# Patient Record
Sex: Male | Born: 2000 | Race: Black or African American | Hispanic: No | Marital: Single | Smoking: Never smoker
Health system: Southern US, Community
[De-identification: ages and names within clinical notes are randomized; demographics above are authoritative.]

## PROBLEM LIST (undated history)

## (undated) DIAGNOSIS — Z87828 Personal history of other (healed) physical injury and trauma: Secondary | ICD-10-CM

## (undated) DIAGNOSIS — F429 Obsessive-compulsive disorder, unspecified: Secondary | ICD-10-CM

## (undated) DIAGNOSIS — F988 Other specified behavioral and emotional disorders with onset usually occurring in childhood and adolescence: Secondary | ICD-10-CM

## (undated) DIAGNOSIS — R569 Unspecified convulsions: Secondary | ICD-10-CM

## (undated) DIAGNOSIS — Z87898 Personal history of other specified conditions: Secondary | ICD-10-CM

## (undated) DIAGNOSIS — K429 Umbilical hernia without obstruction or gangrene: Secondary | ICD-10-CM

## (undated) DIAGNOSIS — F902 Attention-deficit hyperactivity disorder, combined type: Secondary | ICD-10-CM

## (undated) DIAGNOSIS — R278 Other lack of coordination: Secondary | ICD-10-CM

## (undated) DIAGNOSIS — R51 Headache: Secondary | ICD-10-CM

## (undated) HISTORY — DX: Attention-deficit hyperactivity disorder, combined type: F90.2

## (undated) HISTORY — DX: Other lack of coordination: R27.8

## (undated) HISTORY — DX: Obsessive-compulsive disorder, unspecified: F42.9

---
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
## 2018-01-04 ENCOUNTER — Other Ambulatory Visit: Payer: Self-pay | Admitting: Pediatrics

## 2018-01-05 NOTE — Telephone Encounter (Signed)
Last visit 05/15/2017 next visit 01/19/2018

## 2018-01-05 NOTE — Telephone Encounter (Signed)
E-Prescribed Atomoxetine 80 mg directly to  [facility] [place] [place] - [address] [place] [id number] Phone: [phone] Fax: [phone]

## 2018-01-05 NOTE — Telephone Encounter (Signed)
Patient scheduled for August 5.

## 2018-01-19 ENCOUNTER — Telehealth: Payer: Self-pay | Admitting: Pediatrics

## 2018-01-19 ENCOUNTER — Ambulatory Visit (INDEPENDENT_AMBULATORY_CARE_PROVIDER_SITE_OTHER): Payer: Self-pay | Admitting: Pediatrics

## 2018-01-19 ENCOUNTER — Encounter: Payer: Self-pay | Admitting: Pediatrics

## 2018-01-19 VITALS — BP 119/82 | HR 82 | Ht 66.0 in | Wt 148.0 lb

## 2018-01-19 DIAGNOSIS — F422 Mixed obsessional thoughts and acts: Secondary | ICD-10-CM

## 2018-01-19 DIAGNOSIS — Z79899 Other long term (current) drug therapy: Secondary | ICD-10-CM

## 2018-01-19 DIAGNOSIS — Z7189 Other specified counseling: Secondary | ICD-10-CM

## 2018-01-19 DIAGNOSIS — R278 Other lack of coordination: Secondary | ICD-10-CM

## 2018-01-19 DIAGNOSIS — Z719 Counseling, unspecified: Secondary | ICD-10-CM

## 2018-01-19 DIAGNOSIS — F902 Attention-deficit hyperactivity disorder, combined type: Secondary | ICD-10-CM

## 2018-01-19 NOTE — Patient Instructions (Addendum)
DISCUSSION: Patient counseled regarding the following coordination of care items:  No medications today. Patient will transition to adult provider. Transition care to PCP due to age and not currently taking medication.  Mother to contact medicaid to figure out coverage.  Advised importance of:  Good sleep hygiene (8- 10 hours per night) Immediately improve bedtime Limited screen time (none on school nights, no more than 2 hours on weekends) Significant reduction in screen time Regular exercise(outside and active play) Healthy eating (drink water, no sodas/sweet tea, limit portions and no seconds).  Additionally the patient was counseled to take medication while driving.  Teens need about 9 hours of sleep a night. Younger children need more sleep (10-11 hours a night) and adults need slightly less (7-9 hours each night).  11 Tips to Follow:  1. No caffeine after 3pm: Avoid beverages with caffeine (soda, tea, energy drinks, etc.) especially after 3pm. 2. Don't go to bed hungry: Have your evening meal at least 3 hrs. before going to sleep. It's fine to have a small bedtime snack such as a glass of milk and a few crackers but don't have a big meal. 3. Have a nightly routine before bed: Plan on "winding down" before you go to sleep. Begin relaxing about 1 hour before you go to bed. Try doing a quiet activity such as listening to calming music, reading a book or meditating. 4. Turn off the TV and ALL electronics including video games, tablets, laptops, etc. 1 hour before sleep, and keep them out of the bedroom. 5. Turn off your cell phone and all notifications (new email and text alerts) or even better, leave your phone outside your room while you sleep. Studies have shown that a part of your brain continues to respond to certain lights and sounds even while you're still asleep. 6. Make your bedroom quiet, dark and cool. If you can't control the noise, try wearing earplugs or using a fan to block  out other sounds. 7. Practice relaxation techniques. Try reading a book or meditating or drain your brain by writing a list of what you need to do the next day. 8. Don't nap unless you feel sick: you'll have a better night's sleep. 9. Don't smoke, or quit if you do. Nicotine, alcohol, and marijuana can all keep you awake. Talk to your health care provider if you need help with substance use. 10. Most importantly, wake up at the same time every day (or within 1 hour of your usual wake up time) EVEN on the weekends. A regular wake up time promotes sleep hygiene and prevents sleep problems. 11. Reduce exposure to bright light in the last three hours of the day before going to sleep. Maintaining good sleep hygiene and having good sleep habits lower your risk of developing sleep problems. Getting better sleep can also improve your concentration and alertness. Try the simple steps in this guide. If you still have trouble getting enough rest, make an appointment with your health care provider.  Parent/teen counseling is recommended and may include Family counseling.  Consider the following options: [facility]  http://famsolutions.org/ [phone]- [phone]  [facility]  http://www.youthfocus.org/home.html 336 [phone]  Additional resources: COUNSELING AGENCIES in [place] (Accepting Medicaid)  [phone] service coordination hub Provides information on mental health, intellectual/developmental disabilities & substance abuse services in [facility] [address]  "[place]"           [phone] [facility] [address]          [phone] [facility] [address]            [phone]  [facility] [address]            [phone]  [facility] [address]           [phone] [facility] [phone]    [phone]   Habla Espaol/Interprete  [facility] [address]            [phone]   [facility] [address]             [phone] [facility] (SEL) [address]  [phone]  Psychiatric services/servicios psiquiatricos  & Habla Espaol/Interprete [facility] [address]   [phone] [facility] [address]      [phone] [facility] [address] (19 yo & over only)     [phone]   [facility]  [address], [place], [place] [address]                         [phone]

## 2018-01-19 NOTE — Telephone Encounter (Signed)
Called and left message patient show up appointment.

## 2018-01-19 NOTE — Progress Notes (Signed)
[facility] [address], [place]. [address] [place] [address] Dept: [phone] Dept Fax: [phone] Loc: [phone] Loc Fax: [phone]  Medical Follow-up  Patient ID: Bradley [name], male  DOB: [birth year], 17 y.o.  MRN: [id number]  Date of Evaluation: 01/19/18  PCP: Estrella Myrtle, MD  Accompanied by: Self, mother arrived at the end of the visit Patient Lives with: mother and brother Onalee Hua is 12 years  HISTORY/CURRENT STATUS:  Chief Complaint - Polite and cooperative and present for medical follow up for medication management of ADHD, dysgraphia and anxiety/OCD.  Last follow up in November 2018.  Patient is prescribed Strattera 80 mg daily and Evekeo 10 to 20 mg daily.  Not taking medication since end of school in May.  Was away from home and was staying at friends because of work and did not go on meds. Not sure if he wants to be on meds "a lot of stuff has happened" not because of meds.  Not sleeping, reports disrespectful to mother lately.  Recently, three weeks ago, girlfriend committed suicide. Was only dating for about two months. Will think in song - songs for people and words, but not saying out loud.  Not really a problem does it more with friends. Not sure about classes at [employer]. Mother reports he is irritable, more energetic, busier and spending a lot of money.  Buying shoes and clothes.   EDUCATION: School:  Graduated in May 2019  [employer]  Year/Grade: 12th grade  Did finish [employer] Mother reports graduated "by grace". 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  He has not declared a last day.  Not applying for other things.  Wanted to go to [employer] for college but mother did not want to let him go  incase he has transition issues.  Will take classes at [employer]. Not sure when classes start, due to last minute decision to go to [employer] Wants to do A&P, does not want to do math. Wants to do sports medicine.  Plays some sports pick up, not organized  MEDICAL HISTORY: Appetite: WNL  Sleep: Bedtime: 0230  Awake: 0800-1600 Argues that he isn't tired and that his friends are in other countries. Point counter point with conversation about getting better sleep  No concerns for toileting. Daily stool, no constipation or diarrhea. Void urine no difficulty. No enuresis.   Participate in daily oral hygiene to include brushing and flossing.  Individual Medical History/Review of System Changes?Yes, pulled hamstring in March, went to ED.  Allergies: Patient has no known allergies.  Current Medications:  Not currently taking medication Medication Side Effects: None  Family Medical/Social History Changes?: No  MENTAL HEALTH: Mental Health Issues: Denies sadness, loneliness or depression. No self harm or thoughts of self harm or injury. Denies fears, worries and anxieties. Continued OCD but not worse.  Has good peer relations and is not a bully nor is victimized. Girlfriend of 2 months, committed suicide 3 weeks ago.  Also has friends that "use him" for counseling.   Review of Systems  Constitutional: Negative.   HENT: Negative.   Eyes: Negative.   Respiratory: Negative.   Cardiovascular: Negative.   Gastrointestinal: Negative.   Endocrine: Negative.   Genitourinary: Negative.   Musculoskeletal: Negative.   Skin: Negative.   Allergic/Immunologic: Negative.  Neurological: Negative for seizures and headaches.  Hematological: Negative.   Psychiatric/Behavioral: Negative for behavioral problems, decreased concentration and sleep disturbance. The patient is not nervous/anxious and is not hyperactive.   All other systems reviewed and are negative.  PHYSICAL EXAM: Vitals:  Today's  Vitals   01/19/18 1121  BP: 119/82  Pulse: 82  Weight: 148 lb (67.1 kg)  Height: 5\' 6"  (1.676 m)  , 65 %ile (Z= 0.38) based on CDC (Boys, 2-20 Years) BMI-for-age based on BMI available as of 01/19/2018. Body mass index is 23.89 kg/m.  General Exam: Physical Exam  Constitutional: He is oriented to person, place, and time. Vital signs are normal. He appears well-developed and well-nourished. He is cooperative. No distress.  HENT:  Head: Normocephalic.  Right Ear: Tympanic membrane and ear canal normal.  Left Ear: Tympanic membrane and ear canal normal.  Nose: Nose normal.  Mouth/Throat: Uvula is midline, oropharynx is clear and moist and mucous membranes are normal.  Eyes: Pupils are equal, round, and reactive to light. Conjunctivae, EOM and lids are normal.  Neck: Normal range of motion. Neck supple. No thyromegaly present.  Cardiovascular: Normal rate, regular rhythm and intact distal pulses.  Pulmonary/Chest: Effort normal and breath sounds normal.  Abdominal: Soft. Normal appearance.  Genitourinary:  Genitourinary Comments: Deferred  Musculoskeletal: Normal range of motion.  Neurological: He is alert and oriented to person, place, and time. He has normal strength and normal reflexes. He displays no tremor. No cranial nerve deficit or sensory deficit. He exhibits normal muscle tone. He displays a negative Romberg sign. He displays no seizure activity. Coordination and gait normal.  Skin: Skin is warm, dry and intact.  Psychiatric: He has a normal mood and affect. His speech is normal and behavior is normal. Judgment and thought content normal. His mood appears not anxious. His affect is not inappropriate. He is not agitated, not aggressive and not hyperactive. Cognition and memory are normal. He does not express impulsivity or inappropriate judgment. He expresses no suicidal ideation. He expresses no suicidal plans. He is attentive.  Vitals reviewed.  Neurological: oriented to place  and person  Testing/Developmental Screens: CGI:19/12  Reviewed with patient       DIAGNOSES:    ICD-10-CM   1. ADHD (attention deficit hyperactivity disorder), combined type F90.2   2. Dysgraphia R27.8   3. Mixed obsessional thoughts and acts F42.2   4. Medication management Z79.899   5. Patient counseled Z71.9   6. Counseling and coordination of care Z71.89     RECOMMENDATIONS:  Patient Instructions  DISCUSSION: Patient counseled regarding the following coordination of care items:  No medications today. Patient will transition to adult provider. Transition care to PCP due to age and not currently taking medication.  Mother to contact medicaid to figure out coverage.  Advised importance of:  Good sleep hygiene (8- 10 hours per night) Immediately improve bedtime Limited screen time (none on school nights, no more than 2 hours on weekends) Significant reduction in screen time Regular exercise(outside and active play) Healthy eating (drink water, no sodas/sweet tea, limit portions and no seconds).  Additionally the patient was counseled to take medication while driving.  Teens need about 9 hours of sleep a night. Younger children need more sleep (10-11 hours a night) and adults need slightly less (7-9 hours each night).  11 Tips to Follow:  1. No caffeine after 3pm: Avoid beverages with caffeine (soda, tea, energy drinks, etc.) especially after 3pm. 2. Don't go to bed hungry: Have  your evening meal at least 3 hrs. before going to sleep. It's fine to have a small bedtime snack such as a glass of milk and a few crackers but don't have a big meal. 3. Have a nightly routine before bed: Plan on "winding down" before you go to sleep. Begin relaxing about 1 hour before you go to bed. Try doing a quiet activity such as listening to calming music, reading a book or meditating. 4. Turn off the TV and ALL electronics including video games, tablets, laptops, etc. 1 hour before sleep, and  keep them out of the bedroom. 5. Turn off your cell phone and all notifications (new email and text alerts) or even better, leave your phone outside your room while you sleep. Studies have shown that a part of your brain continues to respond to certain lights and sounds even while you're still asleep. 6. Make your bedroom quiet, dark and cool. If you can't control the noise, try wearing earplugs or using a fan to block out other sounds. 7. Practice relaxation techniques. Try reading a book or meditating or drain your brain by writing a list of what you need to do the next day. 8. Don't nap unless you feel sick: you'll have a better night's sleep. 9. Don't smoke, or quit if you do. Nicotine, alcohol, and marijuana can all keep you awake. Talk to your health care provider if you need help with substance use. 10. Most importantly, wake up at the same time every day (or within 1 hour of your usual wake up time) EVEN on the weekends. A regular wake up time promotes sleep hygiene and prevents sleep problems. 11. Reduce exposure to bright light in the last three hours of the day before going to sleep. Maintaining good sleep hygiene and having good sleep habits lower your risk of developing sleep problems. Getting better sleep can also improve your concentration and alertness. Try the simple steps in this guide. If you still have trouble getting enough rest, make an appointment with your health care provider.  Parent/teen counseling is recommended and may include Family counseling.  Consider the following options: [facility]  http://famsolutions.org/ [phone]- [phone]  [facility]  http://www.youthfocus.org/home.html 336 [phone]) [phone]  Additional resources: COUNSELING AGENCIES in [place] (Accepting Medicaid)  [phone]- [phone] service coordination hub Provides information on mental health, intellectual/developmental disabilities & substance abuse services in [facility]  [place]   [facility] [address]  "[place]"           [phone] [facility] [address]          [phone] [facility] [address]            [phone]  [facility] [address]            [phone]  [facility] [address]           [phone] [facility] [address] [phone]    [phone]   Habla Espaol/Interprete  [facility] [address]            [phone]   [facility] [address]             [phone] [facility] (SEL) [address]  [phone]  Psychiatric services/servicios psiquiatricos  & Habla Espaol/Interprete [facility]  [address]   [phone] [facility] [address]      [phone] [facility] [address] (19 yo & over only)     [phone], [place], [place] [address]                         [phone]  Patient verbalized understanding of all topics discussed.   NEXT APPOINTMENT: Return if symptoms worsen or fail to improve. Medical Decision-making: More than 50% of the appointment was spent counseling and discussing diagnosis and management of symptoms with the patient and family.   Leticia Penna, NP Counseling Time: 40 Total Contact Time: 50

## 2018-07-29 IMAGING — DX DG HIP (WITH OR WITHOUT PELVIS) 2-3V*R*
3 series · 3 of 3 positions shown · non-contrast
Comparison: None.

CLINICAL DATA: Basketball injury tonight. Right hip and inguinal
pain.

EXAM:
DG HIP (WITH OR WITHOUT PELVIS) 2-3V RIGHT

[pelvis ap]
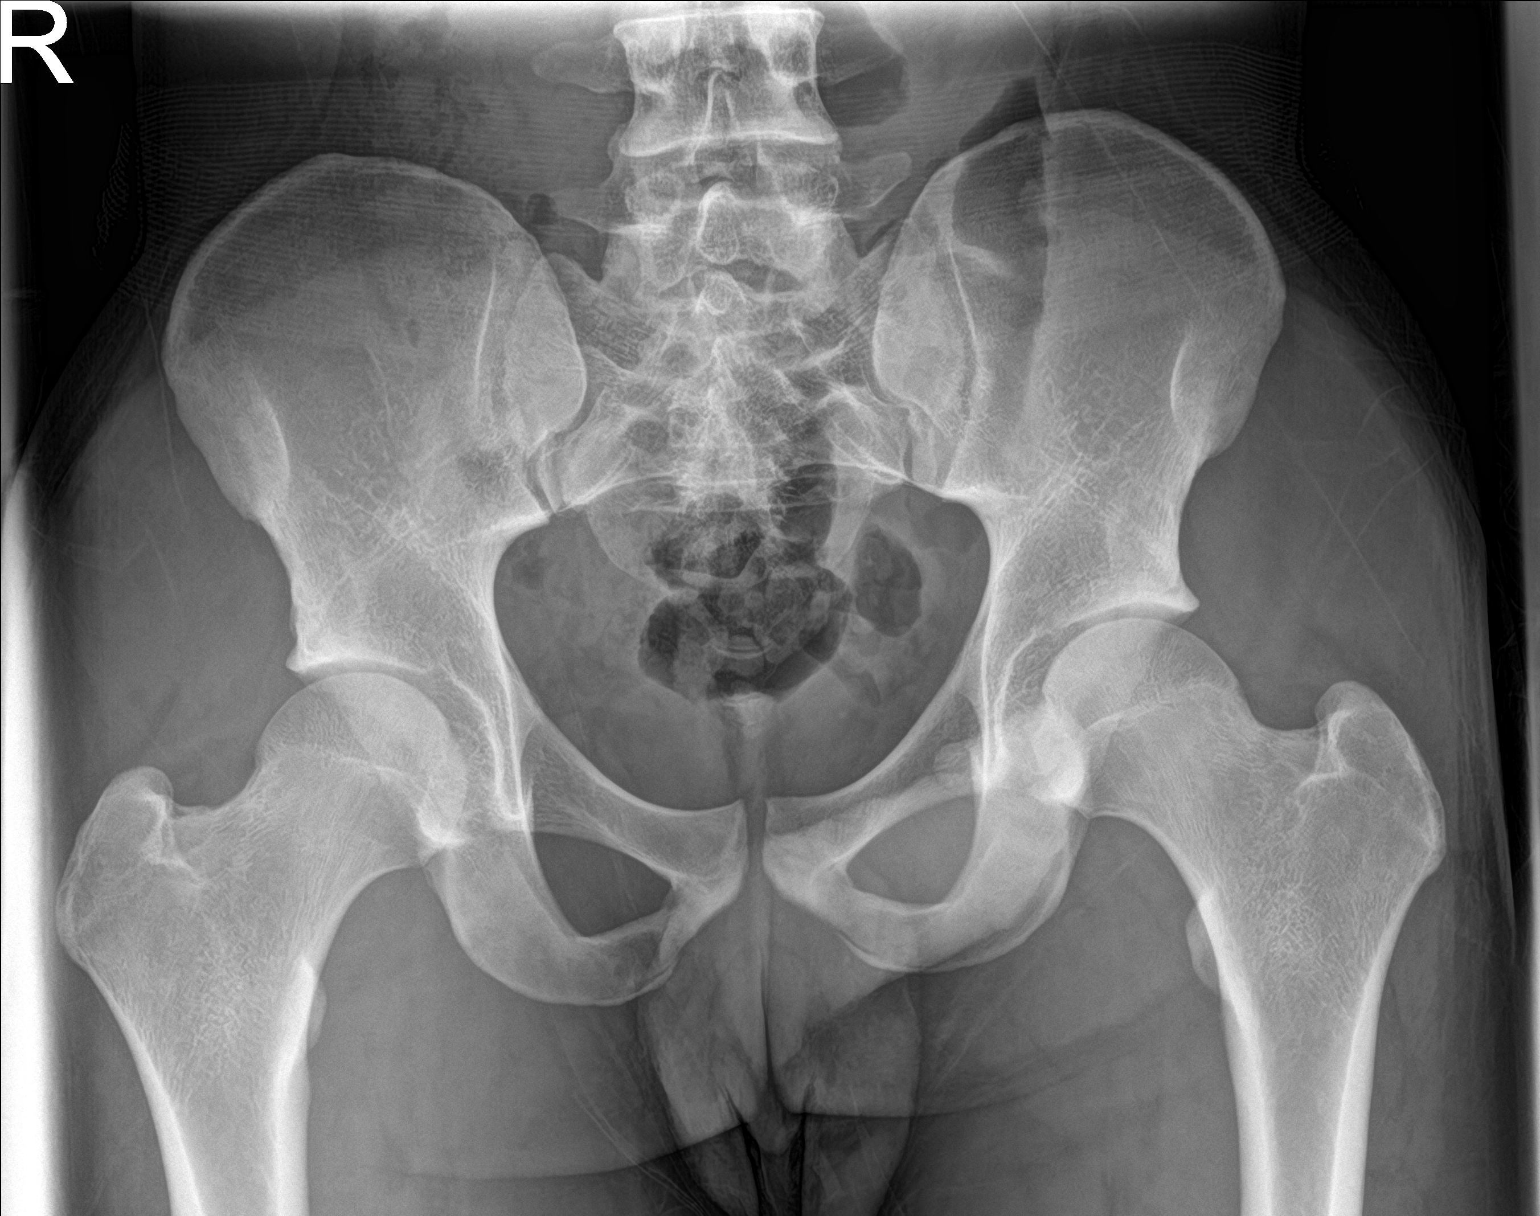

[hip ap]
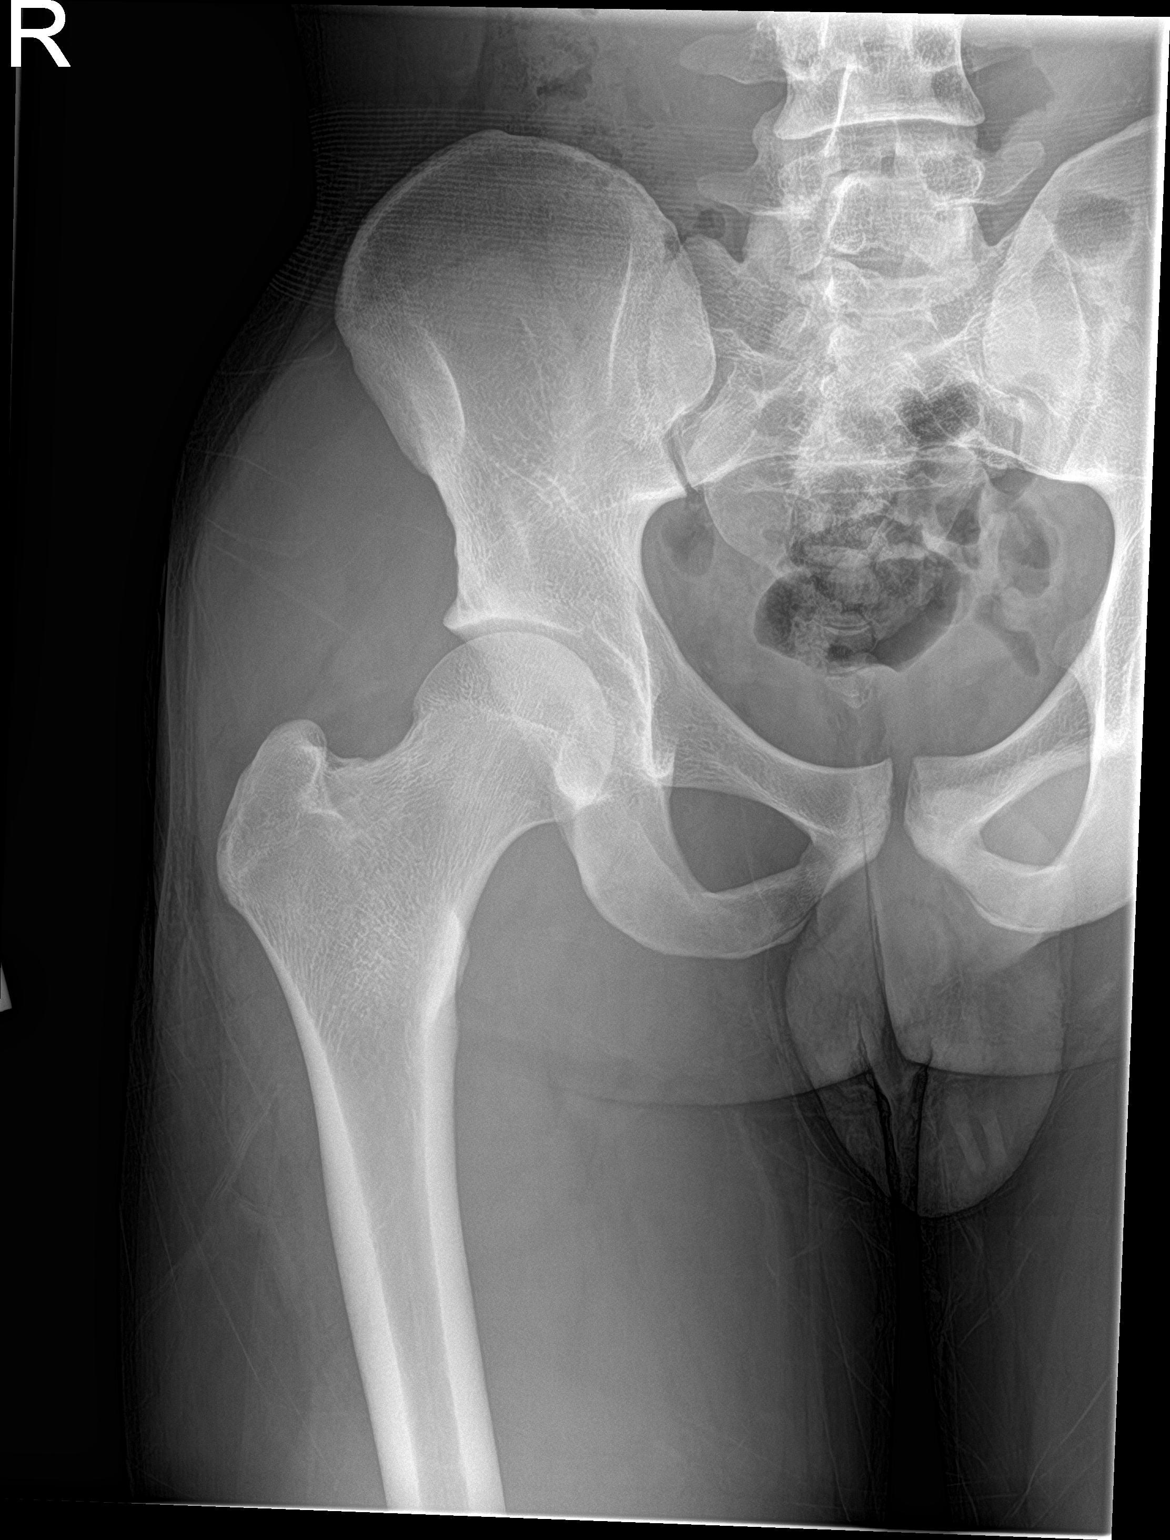

[hip lat]
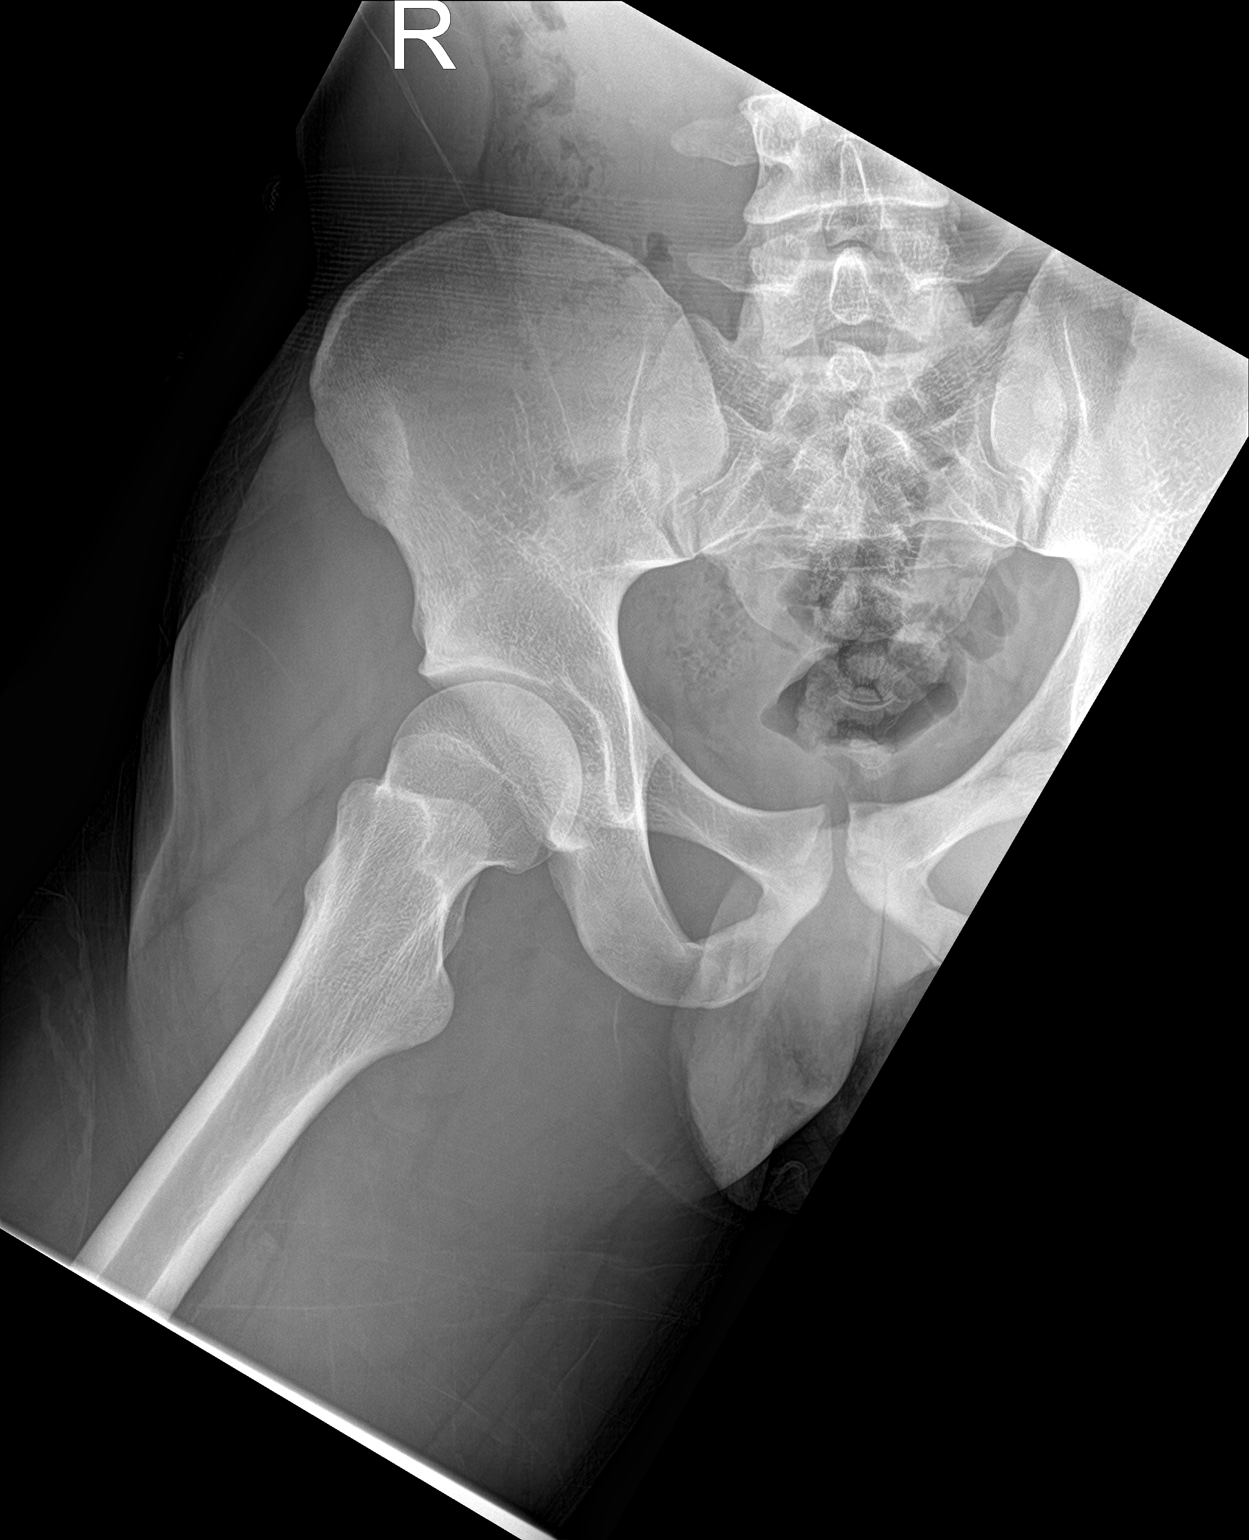

[3 of 3 positions shown; findings below may reference images not displayed]

FINDINGS: There is no evidence of hip fracture or dislocation. There is no
evidence of arthropathy or other focal bone abnormality.
IMPRESSION: Negative.

## 2019-06-18 HISTORY — PX: WISDOM TOOTH EXTRACTION: SHX21

## 2021-03-03 ENCOUNTER — Encounter (HOSPITAL_BASED_OUTPATIENT_CLINIC_OR_DEPARTMENT_OTHER): Payer: Self-pay | Admitting: Emergency Medicine

## 2021-03-03 ENCOUNTER — Emergency Department (HOSPITAL_BASED_OUTPATIENT_CLINIC_OR_DEPARTMENT_OTHER)
Admission: EM | Admit: 2021-03-03 | Discharge: 2021-03-03 | Disposition: A | Discharge status: Home / Self Care | Payer: Medicaid Other | Attending: Emergency Medicine | Admitting: Emergency Medicine

## 2021-03-03 ENCOUNTER — Other Ambulatory Visit: Payer: Self-pay

## 2021-03-03 DIAGNOSIS — T782XXA Anaphylactic shock, unspecified, initial encounter: Secondary | ICD-10-CM | POA: Diagnosis not present

## 2021-03-03 DIAGNOSIS — R0602 Shortness of breath: Secondary | ICD-10-CM | POA: Insufficient documentation

## 2021-03-03 DIAGNOSIS — L500 Allergic urticaria: Secondary | ICD-10-CM | POA: Insufficient documentation

## 2021-03-03 DIAGNOSIS — R079 Chest pain, unspecified: Secondary | ICD-10-CM | POA: Insufficient documentation

## 2021-03-03 MED ORDER — FAMOTIDINE 20 MG PO TABS: 20.0000 mg | ORAL_TABLET | Freq: Every day | ORAL | 0 refills | Status: DC

## 2021-03-03 MED ORDER — PREDNISONE 50 MG PO TABS: ORAL_TABLET | ORAL | 0 refills | Status: DC

## 2021-03-03 MED ORDER — DIPHENHYDRAMINE HCL 50 MG/ML IJ SOLN
25.0000 mg | Freq: Once | INTRAMUSCULAR | Status: AC
  Administered 2021-03-03: 25 mg via INTRAVENOUS

## 2021-03-03 MED ORDER — FAMOTIDINE IN NACL 20-0.9 MG/50ML-% IV SOLN
20.0000 mg | Freq: Once | INTRAVENOUS | Status: AC
  Administered 2021-03-03: 20 mg via INTRAVENOUS
  Filled 2021-03-03: qty 50

## 2021-03-03 MED ORDER — EPINEPHRINE 0.3 MG/0.3ML IJ SOAJ
0.3000 mg | Freq: Once | INTRAMUSCULAR | Status: AC
  Administered 2021-03-03: 0.3 mg via INTRAMUSCULAR
  Filled 2021-03-03: qty 0.3

## 2021-03-03 MED ORDER — PREDNISONE 50 MG PO TABS: ORAL_TABLET | ORAL | Status: DC

## 2021-03-03 MED ORDER — DIPHENHYDRAMINE HCL 50 MG/ML IJ SOLN
INTRAMUSCULAR | Status: AC
  Filled 2021-03-03: qty 1

## 2021-03-03 MED ORDER — DIPHENHYDRAMINE HCL 25 MG PO TABS: 25.0000 mg | ORAL_TABLET | Freq: Four times a day (QID) | ORAL | 0 refills | Status: DC | PRN

## 2021-03-03 MED ORDER — METHYLPREDNISOLONE SODIUM SUCC 125 MG IJ SOLR
125.0000 mg | Freq: Once | INTRAMUSCULAR | Status: AC
  Administered 2021-03-03: 125 mg via INTRAVENOUS
  Filled 2021-03-03: qty 2

## 2021-03-03 MED ORDER — EPINEPHRINE 0.3 MG/0.3ML IJ SOAJ: 0.3000 mg | INTRAMUSCULAR | 0 refills | Status: DC | PRN

## 2021-03-03 NOTE — ED Provider Notes (Signed)
[facility] EMERGENCY DEPARTMENT Provider Note   CSN: [id number] Arrival date & time: 03/03/21  0101     History Chief Complaint  Patient presents with   Allergic Reaction    Bradley [name] is a 20 y.o. male.  Patient presents with possible allergic reaction.  States 1 hour ago he developed swelling to his face, eyes, throat, itchy red rash.  His throat feels tight and he feels short of breath with some difficulty swallowing.  He was at a car meet around exhaust fumes but nothing unusual for him.  Drink some orange juice was the only thing that is new.  He has not had a problem with orange juice in the past.  Denies any new medications, clothing, other exposures.  No regular medication use. Feels tight in his chest and short of breath and tight in his throat.  Has a rash all over and swollen eyes and swollen face. This is never happened before  The history is provided by the patient.  Allergic Reaction Presenting symptoms: rash       Past Medical History:  Diagnosis Date   ADD (attention deficit disorder)    ADHD (attention deficit hyperactivity disorder), combined type 12/07/2015   Dysgraphia 12/07/2015   Headache(784.0)    almost daily - secondary to head injury   History of seizures 2011   after head injury - no seizures since 2013; no current med.   History of sprained ankle 10/2013   left   Obsessive compulsive disorder 12/07/2015   OCD (obsessive compulsive disorder)    Seizures (HCC)    not for 2 years,    Umbilical hernia 01/2014    Patient Active Problem List   Diagnosis Date Noted   Bilateral hip pain 09/02/2016   History of seizures as a child 05/23/2016   ADHD (attention deficit hyperactivity disorder), combined type 12/07/2015   Dysgraphia 12/07/2015   Obsessive compulsive disorder 12/07/2015    Past Surgical History:  Procedure Laterality Date   UMBILICAL HERNIA REPAIR N/A 02/18/2014   Procedure: HERNIA REPAIR UMBILICAL PEDIATRIC;   Surgeon: Judie Petit. Leonia Corona, MD;  Location: [facility];  Service: Pediatrics;  Laterality: N/A;       Family History  Problem Relation Age of Onset   Hypertension Maternal Grandmother    Asthma Maternal Grandmother    Diabetes Maternal Grandfather    Kidney disease Maternal Grandfather        renal failure/kidney transplant   Hypertension Paternal Grandmother    Diabetes Paternal Grandfather    Obsessive Compulsive Disorder Mother    Anxiety disorder Mother    Mental illness Father     Social History   Tobacco Use   Smoking status: Never   Smokeless tobacco: Never  Substance Use Topics   Alcohol use: No    Alcohol/week: 0.0 standard drinks   Drug use: No    Home Medications Prior to Admission medications   Not on File    Allergies    Patient has no known allergies.  Review of Systems   Review of Systems  Constitutional:  Negative for activity change, appetite change and fever.  HENT:  Negative for congestion and rhinorrhea.   Eyes:  Negative for visual disturbance.  Respiratory:  Positive for chest tightness and shortness of breath.   Gastrointestinal:  Negative for abdominal pain, nausea and vomiting.  Genitourinary:  Negative for dysuria and hematuria.  Musculoskeletal:  Negative for arthralgias and myalgias.  Skin:  Positive for rash.  Neurological:  Negative for dizziness, weakness and headaches.   all other systems are negative except as noted in the HPI and PMH.   Physical Exam Updated Vital Signs BP (!) 146/95   Pulse 84   Temp 98.3 F (36.8 C) (Oral)   Resp 15   Ht 5\' 7"  (1.702 m)   Wt 70.3 kg   SpO2 100%   BMI 24.28 kg/m   Physical Exam Vitals and nursing note reviewed.  Constitutional:      General: He is not in acute distress.    Appearance: He is well-developed.  HENT:     Head: Normocephalic and atraumatic.     Mouth/Throat:     Pharynx: No oropharyngeal exudate.     Comments: No drooling, no trismus no stridor,  tongue appears normal, uvula normal, lips normal Eyes:     Conjunctiva/sclera: Conjunctivae normal.     Pupils: Pupils are equal, round, and reactive to light.  Neck:     Comments: No meningismus. Cardiovascular:     Rate and Rhythm: Normal rate and regular rhythm.     Heart sounds: Normal heart sounds. No murmur heard. Pulmonary:     Effort: Pulmonary effort is normal. No respiratory distress.     Breath sounds: Normal breath sounds. No wheezing.  Chest:     Chest wall: No tenderness.  Abdominal:     Palpations: Abdomen is soft.     Tenderness: There is no abdominal tenderness. There is no guarding or rebound.  Musculoskeletal:        General: No tenderness. Normal range of motion.     Cervical back: Normal range of motion and neck supple.  Skin:    General: Skin is warm.     Findings: Rash present.     Comments: Diffuse urticaria involving arms, chest, back and abdomen  Neurological:     Mental Status: He is alert and oriented to person, place, and time.     Cranial Nerves: No cranial nerve deficit.     Motor: No abnormal muscle tone.     Coordination: Coordination normal.     Comments:  5/5 strength throughout. CN 2-12 intact.Equal grip strength.   Psychiatric:        Behavior: Behavior normal.    ED Results / Procedures / Treatments   Labs (all labs ordered are listed, but only abnormal results are displayed) Labs Reviewed - No data to display  EKG None  Radiology No results found.  Procedures .Critical Care Performed by: , MD Authorized by: Glynn Octave, MD   Critical care provider statement:    Critical care time (minutes):  35   Critical care was necessary to treat or prevent imminent or life-threatening deterioration of the following conditions: anaphylaxis.   Critical care was time spent personally by me on the following activities:  Discussions with consultants, evaluation of patient's response to treatment, examination of patient,  ordering and performing treatments and interventions, ordering and review of laboratory studies, ordering and review of radiographic studies, pulse oximetry, re-evaluation of patient's condition, obtaining history from patient or surrogate and review of old charts   Medications Ordered in ED Medications  EPINEPHrine (EPI-PEN) injection 0.3 mg (has no administration in time range)  methylPREDNISolone sodium succinate (SOLU-MEDROL) 125 mg/2 mL injection 125 mg (has no administration in time range)  famotidine (PEPCID) IVPB 20 mg premix (has no administration in time range)  diphenhydrAMINE (BENADRYL) injection 25 mg (has no administration in time range)    ED  Course  I have reviewed the triage vital signs and the nursing notes.  Pertinent labs & imaging results that were available during my care of the patient were reviewed by me and considered in my medical decision making (see chart for details).    MDM Rules/Calculators/A&P                          Allergic reaction with anaphylaxis involving throat swelling, chest pain, shortness of breath, rash.  Patient given epinephrine, steroids and antihistamines  Patient improved.  Rash has resolved.  No further tongue or lip swelling.  He feels back to baseline.  No chest pain or shortness of breath.  Unclear source of allergen.  Patient observed in the ED without deterioration for 4 hours after epinephrine injection.  Will give source of steroids and antihistamines.  Instructions for EpiPen given. Return precautions discussed. Final Clinical Impression(s) / ED Diagnoses Final diagnoses:  Anaphylaxis, initial encounter    Rx / DC Orders ED Discharge Orders     None        Dusan Lipford, Jeannett Senior, MD 03/03/21 [phone]

## 2021-03-03 NOTE — Discharge Instructions (Addendum)
Take the steroids and Benadryl as prescribed.  Use the epinephrine pen only for severe allergic reaction with difficulty breathing, chest pain, throat swelling, tongue swelling, if you use the epinephrine pen you must come to the hospital afterwards. Follow-up with your doctor.  Return to the ED with difficulty breathing, chest pain, any other concerns.

## 2021-03-03 NOTE — ED Notes (Signed)
Patient left ED with ABCs intact, alert and oriented x4, respirations even and unlabored. Discharge instructions reviewed and all questions answered.   

## 2021-03-03 NOTE — ED Notes (Signed)
Patient denies SOB or additional throat swelling. ABCs intact, alert and oriented x4, respirations even and unlabored. Call bell in reach.

## 2021-03-03 NOTE — ED Triage Notes (Signed)
Pt will diffuse swelling and red rash all over x 1 hours. Pt states his throat feels "different" and feels slightly shob. Pt does not know allergen.

## 2021-05-25 ENCOUNTER — Other Ambulatory Visit: Payer: Self-pay

## 2021-05-25 ENCOUNTER — Encounter (HOSPITAL_BASED_OUTPATIENT_CLINIC_OR_DEPARTMENT_OTHER): Payer: Self-pay | Admitting: Emergency Medicine

## 2021-05-25 ENCOUNTER — Emergency Department (HOSPITAL_BASED_OUTPATIENT_CLINIC_OR_DEPARTMENT_OTHER)
Admission: EM | Admit: 2021-05-25 | Discharge: 2021-05-26 | Disposition: A | Discharge status: Home / Self Care | Payer: No Typology Code available for payment source | Attending: Emergency Medicine | Admitting: Emergency Medicine

## 2021-05-25 DIAGNOSIS — Y990 Civilian activity done for income or pay: Secondary | ICD-10-CM | POA: Insufficient documentation

## 2021-05-25 DIAGNOSIS — M542 Cervicalgia: Secondary | ICD-10-CM | POA: Insufficient documentation

## 2021-05-25 DIAGNOSIS — Z5321 Procedure and treatment not carried out due to patient leaving prior to being seen by health care provider: Secondary | ICD-10-CM | POA: Insufficient documentation

## 2021-05-25 DIAGNOSIS — X500XXA Overexertion from strenuous movement or load, initial encounter: Secondary | ICD-10-CM | POA: Insufficient documentation

## 2021-05-25 DIAGNOSIS — M549 Dorsalgia, unspecified: Secondary | ICD-10-CM | POA: Diagnosis present

## 2021-05-25 NOTE — ED Triage Notes (Signed)
Patient arrived via POV c/o back pain x 1 week. Patient states hurting back lifting a tire last week at work, not seen for same. Patient states pain down left neck/back upon waking. Patient states 8/10. Patient is AO x 4, VS WDL, slow gait.

## 2021-05-27 ENCOUNTER — Emergency Department (HOSPITAL_BASED_OUTPATIENT_CLINIC_OR_DEPARTMENT_OTHER)
Admission: EM | Admit: 2021-05-27 | Discharge: 2021-05-27 | Disposition: A | Discharge status: Home / Self Care | Payer: No Typology Code available for payment source | Attending: Emergency Medicine | Admitting: Emergency Medicine

## 2021-05-27 ENCOUNTER — Other Ambulatory Visit: Payer: Self-pay

## 2021-05-27 ENCOUNTER — Encounter (HOSPITAL_BASED_OUTPATIENT_CLINIC_OR_DEPARTMENT_OTHER): Payer: Self-pay | Admitting: *Deleted

## 2021-05-27 DIAGNOSIS — S46811A Strain of other muscles, fascia and tendons at shoulder and upper arm level, right arm, initial encounter: Secondary | ICD-10-CM | POA: Insufficient documentation

## 2021-05-27 DIAGNOSIS — M546 Pain in thoracic spine: Secondary | ICD-10-CM | POA: Diagnosis not present

## 2021-05-27 DIAGNOSIS — X500XXA Overexertion from strenuous movement or load, initial encounter: Secondary | ICD-10-CM | POA: Insufficient documentation

## 2021-05-27 DIAGNOSIS — Y990 Civilian activity done for income or pay: Secondary | ICD-10-CM | POA: Insufficient documentation

## 2021-05-27 DIAGNOSIS — R059 Cough, unspecified: Secondary | ICD-10-CM | POA: Insufficient documentation

## 2021-05-27 MED ORDER — BENZONATATE 100 MG PO CAPS: 100.0000 mg | ORAL_CAPSULE | Freq: Three times a day (TID) | ORAL | 0 refills | Status: DC

## 2021-05-27 MED ORDER — KETOROLAC TROMETHAMINE 15 MG/ML IJ SOLN
15.0000 mg | Freq: Once | INTRAMUSCULAR | Status: AC
  Administered 2021-05-27: 15 mg via INTRAMUSCULAR
  Filled 2021-05-27: qty 1

## 2021-05-27 MED ORDER — AZITHROMYCIN 250 MG PO TABS: 250.0000 mg | ORAL_TABLET | Freq: Every day | ORAL | 0 refills | Status: DC

## 2021-05-27 NOTE — ED Notes (Signed)
Pt complained of a cough just after he was discharged. Informed EDP.

## 2021-05-27 NOTE — ED Triage Notes (Signed)
States he was at work at [employer], lifted a large tire, immediately had pain from base of neck to mid back area. Denies any numbness or tingling in any extremity

## 2021-05-27 NOTE — Discharge Instructions (Signed)
Take 4 over the counter ibuprofen tablets 3 times a day or 2 over-the-counter naproxen tablets twice a day for pain. Also take tylenol 1000mg(2 extra strength) four times a day.    

## 2021-05-27 NOTE — ED Provider Notes (Signed)
[facility] EMERGENCY DEPARTMENT Provider Note   CSN: [id number] Arrival date & time: 05/27/21  [id number]     History Chief Complaint  Patient presents with   Back Pain    Bradley [name] is a 20 y.o. male.  20 yo M with a cc of R upper back pain.  Going on for the past week.  The patient was lifting something heavy at [facility] and then felt a sharp pain to the area.  Had gotten a little bit better and then got worse over the course of the week.  Worse with bending twisting lifting heavy objects.  Has continued to work at [employer].  Patient also has been complaining of a cough.  Is been going on for about 3 weeks now.  Has not seem to get much better.  Has not gotten much worse.  No fevers or chills.  No known sick contacts.  The history is provided by the patient.  Back Pain Pain location: R upper back pain. 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 Gerald Stabs, MD;  Location: [facility];  Service: Pediatrics;  Laterality: N/A;       Family History  Problem Relation Age of Onset   Hypertension Maternal Grandmother    Asthma Maternal Grandmother    Diabetes Maternal Grandfather    Kidney disease Maternal Grandfather        renal failure/kidney transplant   Hypertension Paternal Grandmother    Diabetes Paternal Grandfather    Obsessive Compulsive Disorder Mother    Anxiety disorder Mother    Mental illness Father     Social History   Tobacco Use   Smoking status: Never   Smokeless tobacco: Never  Vaping Use   Vaping Use: Never used  Substance Use Topics   Alcohol use: No    Alcohol/week: 0.0 standard drinks   Drug use: No    Home Medications Prior to Admission medications   Medication Sig Start Date End Date Taking? Authorizing Provider  azithromycin (ZITHROMAX) 250 MG tablet Take 1 tablet (250 mg total) by mouth daily. Take first 2 tablets together, then 1 every day until finished.  05/27/21  Yes Melene Plan, DO  benzonatate (TESSALON) 100 MG capsule Take 1 capsule (100 mg total) by mouth every 8 (eight) hours. 05/27/21  Yes Melene Plan, DO  diphenhydrAMINE (BENADRYL) 25 MG tablet Take 1 tablet (25 mg total) by mouth every 6 (six) hours as needed. 03/03/21   Rancour, Jeannett Senior, MD  EPINEPHrine 0.3 mg/0.3 mL IJ SOAJ injection Inject 0.3 mg into the muscle as needed for anaphylaxis. 03/03/21   Rancour, Jeannett Senior, MD  famotidine (PEPCID) 20 MG tablet Take 1 tablet (20 mg total) by mouth daily. 03/03/21   Rancour, Jeannett Senior, MD  predniSONE (DELTASONE) 50 MG tablet 1 tablet PO daily 03/03/21   Glynn Octave, MD    Allergies    Patient has no known allergies.  Review of  Systems   Review of Systems  Constitutional:  Negative for chills and fever.  HENT:  Negative for congestion and facial swelling.   Eyes:  Negative for discharge and visual disturbance.  Respiratory:  Negative for shortness of breath.   Cardiovascular:  Negative for chest pain and palpitations.  Gastrointestinal:  Negative for abdominal pain, diarrhea and vomiting.  Musculoskeletal:  Positive for back pain. Negative for arthralgias and myalgias.  Skin:  Negative for color change and rash.  Neurological:  Negative for tremors, syncope and headaches.  Psychiatric/Behavioral:  Negative for confusion and dysphoric mood.    Physical Exam Updated Vital Signs BP 137/89 (BP Location: Right Arm)   Pulse 98   Temp 98.6 F (37 C) (Oral)   Resp 16   SpO2 100%   Physical Exam Vitals and nursing note reviewed.  Constitutional:      Appearance: He is well-developed.  HENT:     Head: Normocephalic and atraumatic.  Eyes:     Pupils: Pupils are equal, round, and reactive to light.  Neck:     Vascular: No JVD.  Cardiovascular:     Rate and Rhythm: Normal rate and regular rhythm.     Heart sounds: No murmur heard.   No friction rub. No gallop.  Pulmonary:     Effort: No respiratory distress.     Breath sounds: No wheezing.  Abdominal:     General: There is no distension.     Tenderness: There is no abdominal tenderness. There is no guarding or rebound.  Musculoskeletal:        General: Tenderness present. Normal range of motion.     Cervical back: Normal range of motion and neck supple.     Comments: Pain with palpation to the R trapezius muscle belly.   Skin:    Coloration: Skin is not pale.     Findings: No rash.  Neurological:     Mental Status: He is alert and oriented to person, place, and time.  Psychiatric:        Behavior: Behavior normal.    ED Results / Procedures / Treatments   Labs (all labs ordered are listed, but only abnormal results are displayed) Labs Reviewed -  No data to display  EKG None  Radiology No results found.  Procedures Procedures   Medications Ordered in ED Medications  ketorolac (TORADOL) 15 MG/ML injection 15 mg (15 mg Intramuscular Given 05/27/21 1006)    ED Course  I have reviewed the triage vital signs and the nursing notes.  Pertinent labs & imaging results that were available during my care of the patient were reviewed by me and considered in my medical decision making (see chart for details).  MDM Rules/Calculators/A&P                           20 yo M with a cc R trapezius pain.  Sounds like a trapezius strain by history.  Pain reproduced on exam.  Patient also complaining of a cough this been going on for 3 weeks.  We will treat for possible pertussis.  Clear lung sounds for me no difficulty breathing.  Round of cough medicine.  10:24 AM:  I have discussed the diagnosis/risks/treatment options with the patient and believe the pt to be eligible for discharge home to follow-up with PCP. We also discussed returning to the ED immediately if new or worsening sx occur. We discussed the sx which are most concerning (e.g., sudden worsening pain, fever, inability to tolerate by mouth) that necessitate immediate return. Medications administered to the patient during their visit and any new prescriptions provided to the patient are listed below.  Medications given during this visit Medications  ketorolac (TORADOL) 15 MG/ML injection 15 mg (15 mg Intramuscular Given 05/27/21 1006)     The patient appears reasonably screen and/or stabilized for discharge and I doubt any other medical condition or other [facility] requiring further screening, evaluation, or treatment in the ED at this time prior to discharge.   Final Clinical Impression(s) / ED Diagnoses Final diagnoses:  Trapezius strain, right, initial encounter    Rx / DC Orders ED Discharge Orders          Ordered    benzonatate (TESSALON) 100 MG capsule  Every 8 hours         05/27/21 1016    azithromycin (ZITHROMAX) 250 MG tablet  Daily        05/27/21 Protivin, Alexa Blish, DO 05/27/21 1024

## 2021-05-27 NOTE — ED Notes (Signed)
Gave pt updated discharge papers with prescription. Pt verbalized understanding.

## 2022-04-22 ENCOUNTER — Other Ambulatory Visit: Payer: Self-pay

## 2022-04-22 ENCOUNTER — Emergency Department (HOSPITAL_BASED_OUTPATIENT_CLINIC_OR_DEPARTMENT_OTHER)
Admission: EM | Admit: 2022-04-22 | Discharge: 2022-04-22 | Disposition: A | Discharge status: Home / Self Care | Payer: BC Managed Care – PPO | Attending: Emergency Medicine | Admitting: Emergency Medicine

## 2022-04-22 ENCOUNTER — Emergency Department (HOSPITAL_BASED_OUTPATIENT_CLINIC_OR_DEPARTMENT_OTHER): Payer: BC Managed Care – PPO

## 2022-04-22 ENCOUNTER — Encounter (HOSPITAL_BASED_OUTPATIENT_CLINIC_OR_DEPARTMENT_OTHER): Payer: Self-pay

## 2022-04-22 DIAGNOSIS — M25512 Pain in left shoulder: Secondary | ICD-10-CM | POA: Insufficient documentation

## 2022-04-22 MED ORDER — LIDOCAINE 5 % EX PTCH: 1.0000 | MEDICATED_PATCH | CUTANEOUS | 0 refills | Status: DC

## 2022-04-22 MED ORDER — IBUPROFEN 400 MG PO TABS
400.0000 mg | ORAL_TABLET | Freq: Once | ORAL | Status: AC
  Administered 2022-04-22: 400 mg via ORAL
  Filled 2022-04-22: qty 1

## 2022-04-22 MED ORDER — LIDOCAINE 5 % EX PTCH
1.0000 | MEDICATED_PATCH | CUTANEOUS | Status: DC
  Administered 2022-04-22: 1 via TRANSDERMAL
  Filled 2022-04-22: qty 1

## 2022-04-22 NOTE — ED Provider Notes (Signed)
[facility] EMERGENCY DEPARTMENT Provider Note   CSN: [id number] Arrival date & time: 04/22/22  0152     History  Chief Complaint  Patient presents with   Shoulder Pain    Bradley [name] is a 21 y.o. male.  The history is provided by the patient.  Shoulder Pain Location:  Shoulder Shoulder location:  L shoulder Pain details:    Quality:  Aching   Radiates to:  Does not radiate   Severity:  Moderate   Onset quality:  Gradual   Duration:  2 days   Timing:  Constant   Progression:  Unchanged Dislocation: no   Foreign body present:  No foreign bodies Prior injury to area: does a lot of turning a wheel at work. Relieved by:  Nothing Worsened by:  Nothing Ineffective treatments:  None tried Associated symptoms: no back pain and no fever   Risk factors: no concern for non-accidental trauma        Home Medications Prior to Admission medications   Medication Sig Start Date End Date Taking? Authorizing Provider  lidocaine (LIDODERM) 5 % Place 1 patch onto the skin daily. Remove & Discard patch within 12 hours or as directed by MD 04/22/22  Yes Nickolas Chalfin, MD  azithromycin (ZITHROMAX) 250 MG tablet Take 1 tablet (250 mg total) by mouth daily. Take first 2 tablets together, then 1 every day until finished. 05/27/21   Melene Plan, DO  benzonatate (TESSALON) 100 MG capsule Take 1 capsule (100 mg total) by mouth every 8 (eight) hours. 05/27/21   Melene Plan, DO  diphenhydrAMINE (BENADRYL) 25 MG tablet Take 1 tablet (25 mg total) by mouth every 6 (six) hours as needed. 03/03/21   Rancour, Jeannett Senior, MD  EPINEPHrine 0.3 mg/0.3 mL IJ SOAJ injection Inject 0.3 mg into the muscle as needed for anaphylaxis. 03/03/21   Rancour, Jeannett Senior, MD  famotidine (PEPCID) 20 MG tablet Take 1 tablet (20 mg total) by mouth daily. 03/03/21   Rancour, Jeannett Senior, MD  predniSONE (DELTASONE) 50 MG tablet 1 tablet PO daily 03/03/21   Glynn Octave, MD      Allergies    Patient has no known  allergies.    Review of Systems   Review of Systems  Constitutional:  Negative for fever.  HENT:  Negative for facial swelling.   Eyes:  Negative for redness.  Respiratory:  Negative for wheezing and stridor.   Musculoskeletal:  Positive for arthralgias. Negative for back pain.  All other systems reviewed and are negative.   Physical Exam Updated Vital Signs BP (!) 126/90   Pulse 72   Temp 98.2 F (36.8 C) (Oral)   Resp 16   Ht 5\' 7"  (1.702 m)   Wt 68 kg   SpO2 99%   BMI 23.49 kg/m  Physical Exam Vitals and nursing note reviewed.  Constitutional:      General: He is not in acute distress.    Appearance: Normal appearance. He is well-developed. He is not diaphoretic.  HENT:     Head: Normocephalic and atraumatic.     Nose: Nose normal.  Eyes:     Conjunctiva/sclera: Conjunctivae normal.     Pupils: Pupils are equal, round, and reactive to light.  Cardiovascular:     Rate and Rhythm: Normal rate and regular rhythm.     Pulses: Normal pulses.     Heart sounds: Normal heart sounds.  Pulmonary:     Effort: Pulmonary effort is normal.     Breath sounds: Normal  breath sounds. No wheezing or rales.  Abdominal:     General: Bowel sounds are normal.     Palpations: Abdomen is soft.     Tenderness: There is no abdominal tenderness. There is no guarding or rebound.  Musculoskeletal:        General: Normal range of motion.     Left shoulder: Normal. No swelling, deformity, effusion, laceration or crepitus. Normal strength. Normal pulse.     Left elbow: Normal.     Left forearm: Normal.     Left wrist: No bony tenderness, snuff box tenderness or crepitus.     Left hand: Normal. Normal capillary refill. Normal pulse.     Cervical back: Normal range of motion and neck supple.     Comments: Negative Neer test of the LUE, intact biceps and triceps tendons   Skin:    General: Skin is warm and dry.     Capillary Refill: Capillary refill takes less than 2 seconds.  Neurological:      General: No focal deficit present.     Mental Status: He is alert and oriented to person, place, and time.     Deep Tendon Reflexes: Reflexes normal.  Psychiatric:        Mood and Affect: Mood normal.        Behavior: Behavior normal.     ED Results / Procedures / Treatments   Labs (all labs ordered are listed, but only abnormal results are displayed) Labs Reviewed - No data to display  EKG None  Radiology DG Shoulder Left  Result Date: 04/22/2022 CLINICAL DATA:  Left shoulder pain. EXAM: LEFT SHOULDER - 2+ VIEW COMPARISON:  None Available. FINDINGS: There is no evidence of fracture or dislocation. There is no evidence of arthropathy or other focal bone abnormality. Soft tissues are unremarkable. IMPRESSION: Negative. Electronically Signed   By: Almira Bar M.D.   On: 04/22/2022 02:38    Procedures Procedures    Medications Ordered in ED Medications  lidocaine (LIDODERM) 5 % 1 patch (has no administration in time range)  ibuprofen (ADVIL) tablet 400 mg (has no administration in time range)    ED Course/ Medical Decision Making/ A&P                           Medical Decision Making Patient with 2 days of L shoulder pain   Amount and/or Complexity of Data Reviewed External Data Reviewed: notes.    Details: Previous notes reviewed  Radiology: ordered and independent interpretation performed.    Details: Normal shoulder by me on XR  Risk Prescription drug management. Risk Details: Heat therapy.  Alternate tylenol and ibuprofen.  Lidoderm and gentle ROM so shoulder does not become frozen.  Stable for discharge.      Final Clinical Impression(s) / ED Diagnoses Final diagnoses:  Left shoulder pain, unspecified chronicity  Return for intractable cough, coughing up blood, fevers > 100.4 unrelieved by medication, shortness of breath, intractable vomiting, chest pain, shortness of breath, weakness, numbness, changes in speech, facial asymmetry, abdominal pain,  passing out, Inability to tolerate liquids or food, cough, altered mental status or any concerns. No signs of systemic illness or infection. The patient is nontoxic-appearing on exam and vital signs are within normal limits.  I have reviewed the triage vital signs and the nursing notes. Pertinent labs & imaging results that were available during my care of the patient were reviewed by me and considered in my medical decision  making (see chart for details). After history, exam, and medical workup I feel the patient has been appropriately medically screened and is safe for discharge home. Pertinent diagnoses were discussed with the patient. Patient was given return precautions.   Rx / DC Orders ED Discharge Orders          Ordered    lidocaine (LIDODERM) 5 %  Every 24 hours        04/22/22 0249              Livio Ledwith, MD 04/22/22 8144

## 2022-04-22 NOTE — ED Triage Notes (Signed)
Pt reports left shoulder pain. Pt reports increase in pain over the last few days, unsure of injury. Pt reports lifting heavy at his job. Pt unable to raise left arm.

## 2022-09-13 ENCOUNTER — Emergency Department (HOSPITAL_BASED_OUTPATIENT_CLINIC_OR_DEPARTMENT_OTHER): Payer: BC Managed Care – PPO

## 2022-09-13 ENCOUNTER — Emergency Department (HOSPITAL_BASED_OUTPATIENT_CLINIC_OR_DEPARTMENT_OTHER)
Admission: EM | Admit: 2022-09-13 | Discharge: 2022-09-13 | Disposition: A | Discharge status: Home / Self Care | Payer: BC Managed Care – PPO | Attending: Emergency Medicine | Admitting: Emergency Medicine

## 2022-09-13 ENCOUNTER — Encounter (HOSPITAL_BASED_OUTPATIENT_CLINIC_OR_DEPARTMENT_OTHER): Payer: Self-pay | Admitting: Emergency Medicine

## 2022-09-13 ENCOUNTER — Other Ambulatory Visit: Payer: Self-pay

## 2022-09-13 DIAGNOSIS — S93402A Sprain of unspecified ligament of left ankle, initial encounter: Secondary | ICD-10-CM | POA: Diagnosis not present

## 2022-09-13 DIAGNOSIS — Y9351 Activity, roller skating (inline) and skateboarding: Secondary | ICD-10-CM | POA: Insufficient documentation

## 2022-09-13 DIAGNOSIS — Y92009 Unspecified place in unspecified non-institutional (private) residence as the place of occurrence of the external cause: Secondary | ICD-10-CM | POA: Diagnosis not present

## 2022-09-13 DIAGNOSIS — S93492A Sprain of other ligament of left ankle, initial encounter: Secondary | ICD-10-CM | POA: Insufficient documentation

## 2022-09-13 DIAGNOSIS — T1490XA Injury, unspecified, initial encounter: Secondary | ICD-10-CM | POA: Diagnosis not present

## 2022-09-13 DIAGNOSIS — S99912A Unspecified injury of left ankle, initial encounter: Secondary | ICD-10-CM | POA: Diagnosis not present

## 2022-09-13 NOTE — ED Notes (Signed)
ED Provider at bedside. 

## 2022-09-13 NOTE — ED Triage Notes (Signed)
Pt was skateboarding and fell injuring his left ankle, slight edema noted, reports icing it at home, took ibuprofen around 1130 today

## 2022-09-13 NOTE — Discharge Instructions (Signed)
You were seen in the emergency department for your ankle pain.  Your x-ray showed no broken bones and you likely sprained your ankle.  You can use the Ace wrap to help with swelling and support and you should continue to ice your ankle and keep it elevated.  You can take Tylenol and Motrin as needed for pain.  You can use the crutches as needed until you are able to tolerate walking on your ankle.  You should follow-up with your primary doctor in the next few days to have your symptoms rechecked.  You should return to the emergency department if you have significantly worsening pain, your toes turn black or blue or if you have any other new or concerning symptoms.

## 2022-09-13 NOTE — ED Provider Notes (Signed)
[facility] Provider Note   CSN: [id number] Arrival date & time: 09/13/22  1832     History  Chief Complaint  Patient presents with   Ankle Injury    Bradley [name] is a 22 y.o. male.  Patient is a 22 year old male with no significant past medical history presenting to the emergency department with left ankle pain.  Patient states that he was skateboarding earlier this afternoon when he tripped and fell and rolled his left ankle.  He denies hitting his head or sustaining any other injuries.  He states that he has been unable to walk on his ankle since the fall.  He states that he has been icing it and taking Motrin.  He states that he has had prior ankle sprains in the past that feels similar.  He denies any numbness or weakness.  The history is provided by the patient.  Ankle Injury       Home Medications Prior to Admission medications   Medication Sig Start Date End Date Taking? Authorizing Provider  azithromycin (ZITHROMAX) 250 MG tablet Take 1 tablet (250 mg total) by mouth daily. Take first 2 tablets together, then 1 every day until finished. 05/27/21   Deno Etienne, DO  benzonatate (TESSALON) 100 MG capsule Take 1 capsule (100 mg total) by mouth every 8 (eight) hours. 05/27/21   Deno Etienne, DO  diphenhydrAMINE (BENADRYL) 25 MG tablet Take 1 tablet (25 mg total) by mouth every 6 (six) hours as needed. 03/03/21   Rancour, Annie Main, MD  EPINEPHrine 0.3 mg/0.3 mL IJ SOAJ injection Inject 0.3 mg into the muscle as needed for anaphylaxis. 03/03/21   Rancour, Annie Main, MD  famotidine (PEPCID) 20 MG tablet Take 1 tablet (20 mg total) by mouth daily. 03/03/21   Rancour, Annie Main, MD  lidocaine (LIDODERM) 5 % Place 1 patch onto the skin daily. Remove & Discard patch within 12 hours or as directed by MD 04/22/22   Randal Buba, April, MD  predniSONE (DELTASONE) 50 MG tablet 1 tablet PO daily 03/03/21   Ezequiel Essex, MD      Allergies     Patient has no known allergies.    Review of Systems   Review of Systems  Physical Exam Updated Vital Signs BP (!) 133/95 (BP Location: Right Arm)   Pulse 90   Temp 98.5 F (36.9 C) (Oral)   Resp 18   Ht 5\' 7"  (1.702 m)   Wt 68 kg   SpO2 100%   BMI 23.49 kg/m  Physical Exam Vitals and nursing note reviewed.  Constitutional:      General: He is not in acute distress.    Appearance: Normal appearance.  HENT:     Head: Normocephalic and atraumatic.     Nose: Nose normal.     Mouth/Throat:     Mouth: Mucous membranes are moist.  Eyes:     Extraocular Movements: Extraocular movements intact.  Cardiovascular:     Rate and Rhythm: Normal rate.     Pulses: Normal pulses.  Pulmonary:     Effort: Pulmonary effort is normal.  Abdominal:     General: Abdomen is flat.  Musculoskeletal:        General: Tenderness (Left ankle posterior to the left lateral malleolus and inferior to the left medial malleolus, no significant swelling, no obvious deformity, no bony tenderness to foot or knee, full ankle and turn dorsi lection and tact with pain, no ankle joint laxity he) present. Normal range  of motion.     Cervical back: Normal range of motion.  Skin:    General: Skin is warm and dry.     Findings: No bruising.  Neurological:     General: No focal deficit present.     Mental Status: He is alert and oriented to person, place, and time.     Sensory: No sensory deficit.     Motor: No weakness.  Psychiatric:        Mood and Affect: Mood normal.        Behavior: Behavior normal.     ED Results / Procedures / Treatments   Labs (all labs ordered are listed, but only abnormal results are displayed) Labs Reviewed - No data to display  EKG None  Radiology DG Foot Complete Left  Result Date: 09/13/2022 CLINICAL DATA:  Trauma EXAM: LEFT FOOT - COMPLETE 3 VIEW; LEFT ANKLE COMPLETE - 3 VIEW COMPARISON:  11/06/2013. FINDINGS: There is no evidence of fracture or dislocation. There is  no evidence of arthropathy or other focal bone abnormality. Soft tissues are unremarkable. IMPRESSION: Negative. Electronically Signed   By: Sammie Bench M.D.   On: 09/13/2022 18:58   DG Ankle Complete Left  Result Date: 09/13/2022 CLINICAL DATA:  Trauma EXAM: LEFT FOOT - COMPLETE 3 VIEW; LEFT ANKLE COMPLETE - 3 VIEW COMPARISON:  11/06/2013. FINDINGS: There is no evidence of fracture or dislocation. There is no evidence of arthropathy or other focal bone abnormality. Soft tissues are unremarkable. IMPRESSION: Negative. Electronically Signed   By: Sammie Bench M.D.   On: 09/13/2022 18:58    Procedures Procedures    Medications Ordered in ED Medications - No data to display  ED Course/ Medical Decision Making/ A&P                             Medical Decision Making This patient presents to the ED with chief complaint(s) of ankle pain with no pertinent past medical history which further complicates the presenting complaint. The complaint involves an extensive differential diagnosis and also carries with it a high risk of complications and morbidity.    The differential diagnosis includes fracture, dislocation, ankle sprain, no other traumatic injuries seen on exam   Additional history obtained: Additional history obtained from N/A Records reviewed N/A  ED Course and Reassessment: Patient was initially evaluated by triage and had x-rays of his left foot and ankle performed that showed no acute traumatic injuries.  The patient ankle is neurovascularly intact and likely is a ankle sprain.  He will be given Ace wrap and crutches and was recommended Tylenol, Motrin, ice and elevation.  He was recommended primary care follow-up and was given strict return precautions.  Independent labs interpretation:  N/A  Independent visualization of imaging: - I independently visualized the following imaging with scope of interpretation limited to determining acute life threatening conditions  related to emergency care: L ankle/foot XR, which revealed no acute disease  Consultation: - Consulted or discussed management/test interpretation w/ external professional: N/A  Consideration for admission or further workup: Patient has no emergent conditions requiring admission or further work-up at this time and is stable for discharge home with primary care follow-up  Social Determinants of health: N/A    Amount and/or Complexity of Data Reviewed Radiology: ordered.          Final Clinical Impression(s) / ED Diagnoses Final diagnoses:  Sprain of left ankle, unspecified ligament, initial encounter  Rx / DC Orders ED Discharge Orders     None         Kemper Durie, [place] 09/13/22 1923

## 2022-09-19 ENCOUNTER — Ambulatory Visit
Admission: RE | Admit: 2022-09-19 | Discharge: 2022-09-19 | Disposition: A | Discharge status: Home / Self Care | Payer: BC Managed Care – PPO | Source: Ambulatory Visit | Attending: Urgent Care | Admitting: Urgent Care

## 2022-09-19 VITALS — BP 120/82 | HR 74 | Temp 98.4°F | Resp 16

## 2022-09-19 DIAGNOSIS — S93402A Sprain of unspecified ligament of left ankle, initial encounter: Secondary | ICD-10-CM | POA: Diagnosis not present

## 2022-09-19 MED ORDER — NAPROXEN 500 MG PO TABS: 500.0000 mg | ORAL_TABLET | Freq: Two times a day (BID) | ORAL | 0 refills | Status: DC

## 2022-09-19 NOTE — ED Provider Notes (Signed)
[name]  Note:  This document was prepared using Systems analyst and may include unintentional dictation errors.  MRN: [id number] DOB: 04-01-2001  Subjective:   Bradley [name] is a 22 y.o. male presenting for recheck on persistent left ankle pain.  Injury was suffered on 03/29 skateboarding, was diagnosed with an ankle sprain.  Was provided with crutches and an Ace wrap.  He had negative imaging.  Has used Advil low-dose but not consistently.  He is still having significant difficulty bearing weight on his ankle, is unable to perform his work tasks.  Would like a note for his work.  No current facility-administered medications for this encounter.  Current Outpatient Medications:    azithromycin (ZITHROMAX) 250 MG tablet, Take 1 tablet (250 mg total) by mouth daily. Take first 2 tablets together, then 1 every day until finished., Disp: 6 tablet, Rfl: 0   benzonatate (TESSALON) 100 MG capsule, Take 1 capsule (100 mg total) by mouth every 8 (eight) hours., Disp: 21 capsule, Rfl: 0   diphenhydrAMINE (BENADRYL) 25 MG tablet, Take 1 tablet (25 mg total) by mouth every 6 (six) hours as needed., Disp: 30 tablet, Rfl: 0   EPINEPHrine 0.3 mg/0.3 mL IJ SOAJ injection, Inject 0.3 mg into the muscle as needed for anaphylaxis., Disp: 1 each, Rfl: 0   famotidine (PEPCID) 20 MG tablet, Take 1 tablet (20 mg total) by mouth daily., Disp: 10 tablet, Rfl: 0   lidocaine (LIDODERM) 5 %, Place 1 patch onto the skin daily. 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   History of sprained ankle 10/2013   left   Obsessive compulsive disorder 12/07/2015   OCD (obsessive compulsive disorder)    Seizures    not for 2 years,    Umbilical hernia [id number]     Past Surgical History:  Procedure Laterality Date   UMBILICAL HERNIA REPAIR N/A 02/18/2014   Procedure: HERNIA REPAIR UMBILICAL PEDIATRIC;  Surgeon: Jerilynn Mages. Gerald Stabs, MD;  Location: [facility];  Service: Pediatrics;  Laterality: N/A;    Family History  Problem Relation Age of Onset   Hypertension Maternal Grandmother    Asthma Maternal Grandmother    Diabetes Maternal Grandfather    Kidney disease Maternal Grandfather        renal failure/kidney transplant   Hypertension Paternal Grandmother    Diabetes Paternal Grandfather    Obsessive Compulsive Disorder Mother    Anxiety disorder Mother    Mental illness Father     Social History   Tobacco Use   Smoking status: Never   Smokeless tobacco: Never  Vaping Use   Vaping Use: Never used  Substance Use Topics   Alcohol use: No    Alcohol/week: 0.0 standard drinks of alcohol   Drug use: No    ROS   Objective:   Vitals: BP 120/82 (BP Location: Right Arm)   Pulse 74   Temp 98.4 F (36.9 C) (Oral)  Resp 16   SpO2 96%   Physical Exam Constitutional:      General: He is not in acute distress.    Appearance: Normal appearance. He is well-developed and normal weight. He is not ill-appearing, toxic-appearing or diaphoretic.  HENT:     Head: Normocephalic and atraumatic.     Right Ear: External ear normal.     Left Ear: External ear normal.     Nose: Nose normal.     Mouth/Throat:     Pharynx: Oropharynx is clear.  Eyes:     General: No scleral icterus.       Right eye: No discharge.        Left eye: No discharge.     Extraocular Movements: Extraocular movements intact.  Cardiovascular:     Rate and Rhythm: Normal rate.  Pulmonary:      Effort: Pulmonary effort is normal.  Musculoskeletal:     Cervical back: Normal range of motion.     Left ankle: No swelling, deformity, ecchymosis or lacerations. Tenderness present over the lateral malleolus, ATF ligament and AITF ligament. No medial malleolus, CF ligament, posterior TF ligament, base of 5th metatarsal or proximal fibula tenderness. Decreased range of motion.     Left Achilles Tendon: No tenderness or defects. Thompson's test negative.     Comments: Ambulating with crutches.  Skin:    General: Skin is warm and dry.  Neurological:     Mental Status: He is alert and oriented to person, place, and time.  Psychiatric:        Mood and Affect: Mood normal.        Behavior: Behavior normal.        Thought Content: Thought content normal.        Judgment: Judgment normal.    Left ankle wrapped using 4" Ace wrap in figure-8 method.  Assessment and Plan :   PDMP not reviewed this encounter.  1. Sprain of left ankle, unspecified ligament, initial encounter     Will manage for persistent pain related to his ankle sprain with rice method, recommended naproxen.  Follow-up with an orthopedist or his PCP.  Counseled patient on potential for adverse effects with medications prescribed/recommended today, ER and return-to-clinic precautions discussed, patient verbalized understanding.    Bradley [name], [place] 09/19/22 [id number]

## 2022-09-19 NOTE — ED Triage Notes (Signed)
Pt states he sprained left ankle 3/29 while skateboarding-states he was seen in the ED and is "here for followup"-states he was advised to f/u with PCP and he does not have a PCP-NAD-using crutches-ace wrap in place

## 2022-10-08 ENCOUNTER — Encounter: Payer: Self-pay | Admitting: Family Medicine

## 2022-10-08 ENCOUNTER — Other Ambulatory Visit (HOSPITAL_COMMUNITY)
Admission: RE | Admit: 2022-10-08 | Discharge: 2022-10-08 | Disposition: A | Discharge status: Home / Self Care | Payer: BC Managed Care – PPO | Source: Ambulatory Visit | Attending: Family Medicine | Admitting: Family Medicine

## 2022-10-08 ENCOUNTER — Encounter: Payer: Self-pay | Admitting: Pediatrics

## 2022-10-08 ENCOUNTER — Ambulatory Visit: Payer: BC Managed Care – PPO | Admitting: Family Medicine

## 2022-10-08 VITALS — BP 121/80 | HR 76 | Temp 98.6°F | Ht 67.0 in | Wt 150.5 lb

## 2022-10-08 DIAGNOSIS — Z13220 Encounter for screening for lipoid disorders: Secondary | ICD-10-CM | POA: Diagnosis not present

## 2022-10-08 DIAGNOSIS — Z113 Encounter for screening for infections with a predominantly sexual mode of transmission: Secondary | ICD-10-CM | POA: Insufficient documentation

## 2022-10-08 DIAGNOSIS — Z23 Encounter for immunization: Secondary | ICD-10-CM | POA: Diagnosis not present

## 2022-10-08 DIAGNOSIS — Z0000 Encounter for general adult medical examination without abnormal findings: Secondary | ICD-10-CM | POA: Diagnosis not present

## 2022-10-08 DIAGNOSIS — Z114 Encounter for screening for human immunodeficiency virus [HIV]: Secondary | ICD-10-CM

## 2022-10-08 DIAGNOSIS — M25572 Pain in left ankle and joints of left foot: Secondary | ICD-10-CM

## 2022-10-08 DIAGNOSIS — Z1159 Encounter for screening for other viral diseases: Secondary | ICD-10-CM | POA: Diagnosis not present

## 2022-10-08 LAB — COMPREHENSIVE METABOLIC PANEL
ALT: 18 U/L (ref 0–53)
AST: 21 U/L (ref 0–37)
Albumin: 4.7 g/dL (ref 3.5–5.2)
Alkaline Phosphatase: 65 U/L (ref 39–117)
BUN: 9 mg/dL (ref 6–23)
CO2: 30 mEq/L (ref 19–32)
Calcium: 9.7 mg/dL (ref 8.4–10.5)
Chloride: 102 mEq/L (ref 96–112)
Creatinine, Ser: 1.08 mg/dL (ref 0.40–1.50)
GFR: 96.36 mL/min (ref >60.00)
Glucose, Bld: 74 mg/dL (ref 70–99)
Potassium: 3.9 mEq/L (ref 3.5–5.1)
Sodium: 139 mEq/L (ref 135–145)
Total Bilirubin: 0.7 mg/dL (ref 0.2–1.2)
Total Protein: 7.3 g/dL (ref 6.0–8.3)

## 2022-10-08 LAB — LIPID PANEL
Cholesterol: 146 mg/dL (ref 0–200)
HDL: 58.1 mg/dL (ref >39.00)
LDL Cholesterol: 78 mg/dL (ref 0–99)
NonHDL: 88.37
Total CHOL/HDL Ratio: 3
Triglycerides: 53 mg/dL (ref 0.0–149.0)
VLDL: 10.6 mg/dL (ref 0.0–40.0)

## 2022-10-08 LAB — CBC
HCT: 49 % (ref 39.0–52.0)
Hemoglobin: 16.2 g/dL (ref 13.0–17.0)
MCHC: 33.1 g/dL (ref 30.0–36.0)
MCV: 83.6 fl (ref 78.0–100.0)
Platelets: 186 10*3/uL (ref 150.0–400.0)
RBC: 5.86 Mil/uL — ABNORMAL HIGH (ref 4.22–5.81)
RDW: 13.9 % (ref 11.5–15.5)
WBC: 5.3 10*3/uL (ref 4.0–10.5)

## 2022-10-08 NOTE — Progress Notes (Signed)
Chief Complaint  Patient presents with   New Patient (Initial Visit)    Hurt ankle 2 weeks ago-left Sore throat started this morning/sleeps by an air conditioner STD testing today     Well Male Bradley [name] is here for a complete physical.   His last physical was >1 year ago.  Current diet: in general, a "pretty healthy" diet.   Current exercise: none Weight trend: stable Fatigue out of ordinary? No. Seat belt? Yes.   Advanced directive? No  Health maintenance Tetanus- Due HIV- No Hep C- No  Left ankle pain On 09/13/2022, the patient was skateboarding and fell backwards.  He feels that he everted his left ankle but has pain behind his foot and on the outside of his ankle.  He had swelling and some bruising initially.  He will tweak his ankle every once in a while since this happened and it is not fully better yet.  Sometimes it is difficult to get his shoe on.  He had an x-ray that did not show any fractures or dislocations.  No neurologic signs or symptoms.  Past Medical History:  Diagnosis Date   History of seizures 06/17/2009   no seizures since 2014, happened after head injury   Obsessive compulsive disorder 12/07/2015     Past Surgical History:  Procedure Laterality Date   UMBILICAL HERNIA REPAIR N/A 02/18/2014   Procedure: HERNIA REPAIR UMBILICAL PEDIATRIC;  Surgeon: Judie Petit. Leonia Corona, MD;  Location: [facility];  Service: Pediatrics;  Laterality: N/A;   WISDOM TOOTH EXTRACTION  2021    Medications  Takes no meds routinely.    Allergies No Known Allergies  Family History Family History  Problem Relation Age of Onset   Obsessive Compulsive Disorder Mother    Anxiety disorder Mother    Mental illness Father    Hypertension Maternal Grandmother    Asthma Maternal Grandmother    Breast cancer Maternal Grandmother    Diabetes Maternal Grandfather    Kidney disease Maternal Grandfather        renal failure/kidney transplant    Hypertension Paternal Grandmother    Diabetes Paternal Grandfather    Colon cancer Neg Hx    Prostate cancer Neg Hx    Heart disease Neg Hx     Review of Systems: Constitutional: no fevers or chills Eye:  no recent significant change in vision Ear/Nose/Mouth/Throat:  Ears:  no hearing loss Nose/Mouth/Throat:  no complaints of nasal congestion, no sore throat Cardiovascular:  no chest pain Respiratory:  no shortness of breath Gastrointestinal:  no abdominal pain, no change in bowel habits GU:  Male: negative for dysuria Musculoskeletal/Extremities: + ankle pain on L Integumentary (Skin/Breast):  no abnormal skin lesions reported Neurologic:  no headaches Endocrine: No unexpected weight changes Hematologic/Lymphatic:  no night sweats  Exam BP 121/80 (BP Location: Left Arm, Patient Position: Sitting, Cuff Size: Normal)   Pulse 76   Temp 98.6 F (37 C) (Oral)   Ht  (1.702 m)   Wt 150 lb 8 oz (68.3 kg)   SpO2 99%   BMI 23.57 kg/m  General:  well developed, well nourished, in no apparent distress Skin:  no significant moles, warts, or growths Head:  no masses, lesions, or tenderness Eyes:  pupils equal and round, sclera anicteric without injection Ears:  canals without lesions, TMs shiny without retraction, no obvious effusion, no erythema Nose:  nares patent, mucosa normal Throat/Pharynx:  lips and gingiva without lesion; tongue and uvula midline; non-inflamed pharynx;  no exudates or postnasal drainage Neck: neck supple without adenopathy, thyromegaly, or masses Lungs:  clear to auscultation, breath sounds equal bilaterally, no respiratory distress Cardio:  regular rate and rhythm, no bruits, no LE edema Abdomen:  abdomen soft, nontender; bowel sounds normal; no masses or organomegaly Genital (male): Deferred Rectal: Deferred Musculoskeletal: Left ankle: + TTP over the Achilles tendon and ATFL, no bony tenderness, no deformity or edema, negative anterior drawer, squeeze  test, no proximal fibular head tenderness; symmetrical muscle groups noted without atrophy or deformity Extremities:  no clubbing, cyanosis, or edema, no deformities, no skin discoloration Neuro:  gait normal; deep tendon reflexes normal and symmetric Psych: well oriented with normal range of affect and appropriate judgment/insight  Assessment and Plan  Well adult exam - Plan: CBC, Comprehensive metabolic panel, Lipid panel  Encounter for hepatitis C screening test for low risk patient - Plan: Hepatitis C antibody  Screening for HIV without presence of risk factors - Plan: HIV Antibody (routine testing w rflx)  Acute left ankle pain  Screening examination for STI - Plan: Urine cytology ancillary only([facility])  Need for Tdap vaccination - Plan: Tdap vaccine greater than or equal to 7yo IM   Well 22 y.o. male. Counseled on diet and exercise. Self testicular exams recommended at least monthly.  Advanced directive form provided today.  Other orders as above. Ankle pain: Suspect an ATFL injury in addition to the Achilles tendon.  Heat, ice, Tylenol.  Stretches and exercises for the Achilles tendon and ankle provided.  If no improvement in next several weeks, he will send me a message or call and we will set him up with the sports medicine team. Will screen for STIs, no current symptoms or concerns otherwise. Tdap today. Follow up in 1 year pending the above workup. The patient voiced understanding and agreement to the plan.  Jilda Roche [place], DO 10/08/22 10:09 AM

## 2022-10-08 NOTE — Patient Instructions (Addendum)
Give [place] 2-3 business days to get the results of your labs back.   Keep the diet clean and stay active.  Please get me a copy of your advanced directive form at your convenience.   OK to take Tylenol 1000 mg (2 extra strength tabs) or 975 mg (3 regular strength tabs) every 6 hours as needed.  Aim to do some physical exertion for 150 minutes per week. This is typically divided into 5 days per week, 30 minutes per day. The activity should be enough to get your heart rate up. Anything is better than nothing if you have time constraints.  Do monthly self testicular checks in the shower. You are feeling for lumps/bumps that don't belong. If you feel anything like this, let me know!  Send me a message in 3-4 weeks if no better and we will proceed accordingly.   Let us know if you need anything.  Ankle Exercises It is normal to feel mild stretching, pulling, tightness, or discomfort as you do these exercises, but you should stop right away if you feel sudden pain or your pain gets worse.  Stretching and range of motion exercises These exercises warm up your muscles and joints and improve the movement and flexibility of your ankle. These exercises also help to relieve pain, numbness, and tingling. Exercise A: Dorsiflexion/Plantar Flexion    Sit with your affected knee straight or bent. Do not rest your foot on anything. Flex your affected ankle to tilt the top of your foot toward your shin. Hold this position for 5 seconds. Point your toes downward to tilt the top of your foot away from your shin. Hold this position for 5 seconds. Repeat 2 times. Complete this exercise 3 times per week. Exercise B: Ankle Alphabet    Sit with your affected foot supported at your lower leg. Do not rest your foot on anything. Make sure your foot has room to move freely. Think of your affected foot as a paintbrush, and move your foot to trace each letter of the alphabet in the air. Keep your hip and knee still  while you trace. Make the letters as large as you can without increasing any discomfort. Trace every letter from A to Z. Repeat 2 times. Complete this exercise 3 times per week. Strengthening exercises These exercises build strength and endurance in your ankle. Endurance is the ability to use your muscles for a long time, even after they get tired. Exercise D: Dorsiflexors    Secure a rubber exercise band or tube to an object, such as a table leg, that will stay still when the band is pulled. Secure the other end around your affected foot. Sit on the floor, facing the object with your affected leg extended. The band or tube should be slightly tense when your foot is relaxed. Slowly flex your affected ankle and toes to bring your foot toward you. Hold this position for 3 seconds.  Slowly return your foot to the starting position, controlling the band as you do that. Do a total of 10 repetitions. Repeat 2 times. Complete this exercise 3 times per week. Exercise E: Plantar Flexors    Sit on the floor with your affected leg extended. Loop a rubber exercise band or tube around the ball of your affected foot. The ball of your foot is on the walking surface, right under your toes. The band or tube should be slightly tense when your foot is relaxed. Slowly point your toes downward, pushing them away  from you. Hold this position for 3 seconds. Slowly release the tension in the band or tube, controlling smoothly until your foot is back in the starting position. Repeat for a total of 10 repetitions. Repeat 2 times. Complete this exercise 3 times per week. Exercise F: Towel Curls    Sit in a chair on a non-carpeted surface, and put your feet on the floor. Place a towel in front of your feet.  Keeping your heel on the floor, put your affected foot on the towel. Pull the towel toward you by grabbing the towel with your toes and curling them under. Keep your heel on the floor. Let your toes  relax. Grab the towel again. Keep going until the towel is completely underneath your foot. Repeat for a total of 10 repetitions. Repeat 2 times. Complete this exercise 3 times per week. Exercise G: Heel Raise ( Plantar Flexors, Standing)     Stand with your feet shoulder-width apart. Keep your weight spread evenly over the width of your feet while you rise up on your toes. Use a wall or table to steady yourself, but try not to use it for support. If this exercise is too easy, try these options: Shift your weight toward your affected leg until you feel challenged. If told by your health care provider, lift your uninjured leg off the floor. Hold this position for 3 seconds. Repeat for a total of 10 repetitions. Repeat 2 times. Complete this exercise 3 times per week. Exercise H: Tandem Walking Stand with one foot directly in front of the other. Slowly raise your back foot up, lifting your heel before your toes, and place it directly in front of your other foot. Continue to walk in this heel-to-toe way for 10 steps or for as long as told by your health care provider. Have a countertop or wall nearby to use if needed to keep your balance, but try not to hold onto anything for support. Repeat 2 times. Complete this exercises 3 times per week. Make sure you discuss any questions you have with your health care provider. Document Released: 04/17/2005 Document Revised: 02/01/2016 Document Reviewed: 02/19/2015 Elsevier Interactive Patient Education  2018 [employer]  Achilles Tendinitis Rehab It is normal to feel mild stretching, pulling, tightness, or discomfort as you do these exercises, but you should stop right away if you feel sudden pain or your pain gets worse.   Stretching and range of motion exercises These exercises warm up your muscles and joints and improve the movement and flexibility of your ankle. These exercises also help to relieve pain, numbness, and tingling. Exercise A:  Standing wall calf stretch, knee straight  Stand with your hands against a wall. Extend your affected leg behind you and bend your front knee slightly. Keep both of your heels on the floor. Point the toes of your back foot slightly inward. Keeping your heels on the floor and your back knee straight, shift your weight toward the wall. Do not allow your back to arch. You should feel a gentle stretch in your calf. Hold this position for seconds. Repeat 2 times. Complete this stretch 3 times per week Exercise B: Standing wall calf stretch, knee bent Stand with your hands against a wall. Extend your affected leg behind you, and bend your front knee slightly. Keep both of your heels on the floor. Point the toes of your back foot slightly inward. Keeping your heels on the floor, unlock your back knee so that it is bent.  You should feel a gentle stretch deep in your calf. Hold this position for 30 seconds. Repeat 2 times. Complete this stretch 3 times per week.  Strengthening exercises These exercises build strength and control of your ankle. Endurance is the ability to use your muscles for a long time, even after they get tired. Exercise C: Plantar flexion with band  Sit on the floor with your affected leg extended. You may put a pillow under your calf to give your foot more room to move. Loop a rubber exercise band or tube around the ball of your affected foot. The ball of your foot is on the walking surface, right under your toes. The band or tube should be slightly tense when your foot is relaxed. If the band or tube slips, you can put on your shoe or put a washcloth between the band and your foot to help it stay in place. Slowly point your toes downward, pushing them away from you. Hold this position for 10 seconds. Slowly release the tension in the band or tube, controlling smoothly until your foot is back to the starting position. Repeat 2 times. Complete this exercise 3 times per  week. Exercise D: Heel raise with eccentric lower  Stand on a step with the balls of your feet. The ball of your foot is on the walking surface, right under your toes. Do not put your heels on the step. For balance, rest your hands on the wall or on a railing. Rise up onto the balls of your feet. Keeping your heels up, shift all of your weight to your affected leg and pick up your other leg. Slowly lower your affected leg so your heel drops below the level of the step. Put down your foot. If told by your health care provider, build up to: 3 sets of 15 repetitions while keeping your knees straight. 3 sets of 15 repetitions while keeping your knees bent as far as told by your health care provider.  Complete this exercise 3 times per week. If this exercise is too easy, try doing it while wearing a backpack with weights in it. Balance exercises These exercises improve or maintain your balance. Balance is important in preventing falls. Exercise E: Single leg stand Without shoes, stand near a railing or in a door frame. Hold on to the railing or door frame as needed. Stand on your affected foot. Keep your big toe down on the floor and try to keep your arch lifted. Hold this position for 10 seconds. Repeat 2 times. Complete this exercise 3 times per week. If this exercise is too easy, you can try it with your eyes closed or while standing on a pillow. Make sure you discuss any questions you have with your health care provider. Document Released: 01/02/2005 Document Revised: 02/08/2016 Document Reviewed: 02/07/2015 Elsevier Interactive Patient Education  [employer].

## 2022-10-09 LAB — URINE CYTOLOGY ANCILLARY ONLY
Chlamydia: NEGATIVE
Comment: NEGATIVE
Comment: NORMAL
Neisseria Gonorrhea: NEGATIVE

## 2022-10-09 LAB — HIV ANTIBODY (ROUTINE TESTING W REFLEX): HIV 1&2 Ab, 4th Generation: NONREACTIVE

## 2022-10-09 LAB — HEPATITIS C ANTIBODY: Hepatitis C Ab: NONREACTIVE

## 2022-12-24 ENCOUNTER — Other Ambulatory Visit: Payer: Self-pay

## 2022-12-24 ENCOUNTER — Emergency Department (HOSPITAL_BASED_OUTPATIENT_CLINIC_OR_DEPARTMENT_OTHER)
Admission: EM | Admit: 2022-12-24 | Discharge: 2022-12-24 | Disposition: A | Discharge status: Home / Self Care | Payer: BC Managed Care – PPO | Attending: Emergency Medicine | Admitting: Emergency Medicine

## 2022-12-24 ENCOUNTER — Emergency Department (HOSPITAL_BASED_OUTPATIENT_CLINIC_OR_DEPARTMENT_OTHER): Payer: BC Managed Care – PPO

## 2022-12-24 ENCOUNTER — Encounter (HOSPITAL_BASED_OUTPATIENT_CLINIC_OR_DEPARTMENT_OTHER): Payer: Self-pay

## 2022-12-24 DIAGNOSIS — R079 Chest pain, unspecified: Secondary | ICD-10-CM | POA: Diagnosis not present

## 2022-12-24 DIAGNOSIS — M542 Cervicalgia: Secondary | ICD-10-CM | POA: Diagnosis not present

## 2022-12-24 DIAGNOSIS — M545 Low back pain, unspecified: Secondary | ICD-10-CM | POA: Diagnosis not present

## 2022-12-24 DIAGNOSIS — Y92410 Unspecified street and highway as the place of occurrence of the external cause: Secondary | ICD-10-CM | POA: Insufficient documentation

## 2022-12-24 DIAGNOSIS — M7918 Myalgia, other site: Secondary | ICD-10-CM | POA: Insufficient documentation

## 2022-12-24 DIAGNOSIS — R0789 Other chest pain: Secondary | ICD-10-CM | POA: Insufficient documentation

## 2022-12-24 MED ORDER — METHOCARBAMOL 500 MG PO TABS: 500.0000 mg | ORAL_TABLET | Freq: Two times a day (BID) | ORAL | 0 refills | Status: AC

## 2022-12-24 MED ORDER — IBUPROFEN 400 MG PO TABS
600.0000 mg | ORAL_TABLET | Freq: Once | ORAL | Status: AC
  Administered 2022-12-24: 600 mg via ORAL
  Filled 2022-12-24: qty 1
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
## 2023-06-25 ENCOUNTER — Emergency Department (HOSPITAL_BASED_OUTPATIENT_CLINIC_OR_DEPARTMENT_OTHER)
Admission: EM | Admit: 2023-06-25 | Discharge: 2023-06-25 | Disposition: A | Discharge status: Home / Self Care | Payer: Self-pay | Attending: Emergency Medicine | Admitting: Emergency Medicine

## 2023-06-25 ENCOUNTER — Other Ambulatory Visit: Payer: Self-pay

## 2023-06-25 ENCOUNTER — Encounter (HOSPITAL_BASED_OUTPATIENT_CLINIC_OR_DEPARTMENT_OTHER): Payer: Self-pay | Admitting: Emergency Medicine

## 2023-06-25 DIAGNOSIS — K047 Periapical abscess without sinus: Secondary | ICD-10-CM | POA: Insufficient documentation

## 2023-06-25 MED ORDER — HYDROCODONE-ACETAMINOPHEN 5-325 MG PO TABS
1.0000 | ORAL_TABLET | Freq: Once | ORAL | Status: AC
  Administered 2023-06-25: 1 via ORAL
  Filled 2023-06-25: qty 1

## 2023-06-25 MED ORDER — AMOXICILLIN 500 MG PO CAPS: 500.0000 mg | ORAL_CAPSULE | Freq: Two times a day (BID) | ORAL | 0 refills | Status: AC

## 2023-06-25 NOTE — ED Provider Notes (Signed)
 [place] [facility] [place] Provider Note   CSN: [id number] Arrival date & time: 06/25/23  1309     History  Chief Complaint  Patient presents with   Facial Swelling    Bradley [name] is a 23 y.o. male.  With a history of ADHD, OCD presenting to the ED for evaluation of dental pain.  He reports intermittent dental pain over the past couple of years.  States he started to develop right lower dental pain 2 days ago.  He woke up this morning with swelling to the right mandible.  He has not tried to eat anything today but states that he was able to drink water without difficulty.  He denies any sore throat.  No changes in voice.  No fevers or chills.  He took ibuprofen  yesterday with minimal improvement in his symptoms.  He has not seen a dentist in 2 years.  He was told by dentist today that he would be able to have an appointment at 3 PM but states that he came to the emergency department instead.  No nausea or vomiting.   HPI     Home Medications Prior to Admission medications   Medication Sig Start Date End Date Taking? Authorizing Provider  amoxicillin  (AMOXIL ) 500 MG capsule Take 1 capsule (500 mg total) by mouth 2 (two) times daily for 7 days. 06/25/23 07/02/23 Yes Delilah Mulgrew, Marsa HERO, PA-C  methocarbamol  (ROBAXIN ) 500 MG tablet Take 1 tablet (500 mg total) by mouth 2 (two) times daily. 12/24/22   Neldon Hamp RAMAN, PA      Allergies    Patient has no known allergies.    Review of Systems   Review of Systems  HENT:  Positive for dental problem.   All other systems reviewed and are negative.   Physical Exam Updated Vital Signs BP (!) 134/93   Pulse 68   Temp 97.8 F (36.6 C)   Resp 18   Wt 68 kg   SpO2 99%   BMI 23.49 kg/m  Physical Exam Vitals and nursing note reviewed.  Constitutional:      General: He is not in acute distress.    Appearance: Normal appearance. He is normal weight. He is not ill-appearing.  HENT:     Head:  Normocephalic and atraumatic.     Mouth/Throat:      Comments: Swelling of the face overlying the right mandible.  No overlying skin changes.  No purulence expressed from the gums.  No drooling, trismus or tripoding.  Uvula midline.  Tonsils 1+ bilaterally.  No posterior pharyngeal exudates.  No submandibular erythema or induration.  No neck rigidity.  Soft palate rises with phonation.  Tongue resting on the floor of the mouth.  Poor dentition throughout with missing teeth to bilateral portions of the mandible Pulmonary:     Effort: Pulmonary effort is normal. No respiratory distress.  Abdominal:     General: Abdomen is flat.  Musculoskeletal:        General: Normal range of motion.     Cervical back: Neck supple.  Skin:    General: Skin is warm and dry.  Neurological:     Mental Status: He is alert and oriented to person, place, and time.  Psychiatric:        Mood and Affect: Mood normal.        Behavior: Behavior normal.     ED Results / Procedures / Treatments   Labs (all labs ordered are listed, but  only abnormal results are displayed) Labs Reviewed - No data to display  EKG None  Radiology No results found.  Procedures Procedures    Medications Ordered in ED Medications  HYDROcodone -acetaminophen  (NORCO/VICODIN) 5-325 MG per tablet 1 tablet (has no administration in time range)    ED Course/ Medical Decision Making/ A&P                                 Medical Decision Making This patient presents to the ED for concern of dental pain, this involves an extensive number of treatment options, and is a complaint that carries with it a high risk of complications and morbidity.  The differential diagnosis includes dental fracture, pulpitis, periapical or other periodontal abscess, Ludwig's angina  My initial workup includes symptom control  Additional history obtained from: Nursing notes from this visit.  Afebrile, hemodynamically stable.  23 year old male  presenting to the ED for evaluation of right lower dental pain.  This is been present for the past 2 weeks.  Swelling began this morning.  No fevers or chills.  He has not seen a dentist in multiple years.  He reports years of intermittent dental pains.  He has tried ibuprofen  with minimal improvement.  On exam, there is swelling overlying the right mandible.  I am unable to express any purulent drainage from the gums.  There is no drooling, trismus or tripoding.  No signs to suggest Ludwig angina.  Suspect dental infection.  Unable to identify any drainable fluid collection.  He was given a dose of pain medication in the emergency department.  He was sent a prescription for antibiotics and encouraged to follow-up with his dentist as soon as possible.  He was given a pharmacist, [facility].  He was encouraged to alternate Tylenol  and ibuprofen  and given appropriate dosing.  He was given return precautions.  Stable at discharge.  At this time there does not appear to be any evidence of an acute emergency medical condition and the patient appears stable for discharge with appropriate outpatient follow up. Diagnosis was discussed with patient who verbalizes understanding of care plan and is agreeable to discharge. I have discussed return precautions with patient who verbalizes understanding. Patient encouraged to follow-up with their PCP within 1 week. All questions answered.  Note: Portions of this report may have been transcribed using voice recognition software. Every effort was made to ensure accuracy; however, inadvertent computerized transcription errors may still be present.         Final Clinical Impression(s) / ED Diagnoses Final diagnoses:  Dental infection    Rx / DC Orders ED Discharge Orders          Ordered    amoxicillin  (AMOXIL ) 500 MG capsule  2 times daily        06/25/23 1413              Edwardo Marsa HERO, DEVONNA 06/25/23 1413    Dean Clarity,  MD 06/25/23 1420

## 2023-06-25 NOTE — ED Notes (Signed)

## 2023-06-25 NOTE — Discharge Instructions (Signed)
 You have been seen today for your complaint of dental pain. Your discharge medications include amoxicillin . This is an antibiotic. You should take it as prescribed. You should take it for the entire duration of the prescription. This may cause an upset stomach. This is normal. You may take this with food. You may also eat yogurt to prevent diarrhea.  Alternate tylenol  and ibuprofen  for pain. You may alternate these every 4 hours. You may take up to 800 mg of ibuprofen  at a time and up to 1000 mg of tylenol . Follow up with: A dentist as soon as possible Please seek immediate medical care if you develop any of the following symptoms: You have a fever or chills. Your symptoms suddenly get worse. You have a very bad headache. You have problems breathing or swallowing. You have trouble opening your mouth. You have swelling in your neck or close to your eye. At this time there does not appear to be the presence of an emergent medical condition, however there is always the potential for conditions to change. Please read and follow the below instructions.  Do not take your medicine if  develop an itchy rash, swelling in your mouth or lips, or difficulty breathing; call 911 and seek immediate emergency medical attention if this occurs.  You may review your lab tests and imaging results in their entirety on your MyChart account.  Please discuss all results of fully with your primary care provider and other specialist at your follow-up visit.  Note: Portions of this text may have been transcribed using voice recognition software. Every effort was made to ensure accuracy; however, inadvertent computerized transcription errors may still be present.

## 2023-06-25 NOTE — ED Triage Notes (Signed)
 Obvious lower right face swelling , lower toothache .

## 2023-06-30 ENCOUNTER — Other Ambulatory Visit: Payer: Self-pay

## 2023-06-30 ENCOUNTER — Encounter (HOSPITAL_BASED_OUTPATIENT_CLINIC_OR_DEPARTMENT_OTHER): Payer: Self-pay | Admitting: Emergency Medicine

## 2023-06-30 DIAGNOSIS — W448XXA Other foreign body entering into or through a natural orifice, initial encounter: Secondary | ICD-10-CM | POA: Insufficient documentation

## 2023-06-30 DIAGNOSIS — T161XXA Foreign body in right ear, initial encounter: Secondary | ICD-10-CM | POA: Insufficient documentation

## 2023-06-30 NOTE — ED Triage Notes (Signed)
 Patient presents with bilat ear fullness since this am. States "I tried flushing them out but didn't help"

## 2023-07-01 ENCOUNTER — Emergency Department (HOSPITAL_BASED_OUTPATIENT_CLINIC_OR_DEPARTMENT_OTHER)
Admission: EM | Admit: 2023-07-01 | Discharge: 2023-07-01 | Disposition: A | Discharge status: Home / Self Care | Payer: Self-pay | Attending: Emergency Medicine | Admitting: Emergency Medicine

## 2023-07-01 DIAGNOSIS — H9201 Otalgia, right ear: Secondary | ICD-10-CM

## 2023-07-01 DIAGNOSIS — T161XXS Foreign body in right ear, sequela: Secondary | ICD-10-CM

## 2023-07-01 MED ORDER — NEOMYCIN-POLYMYXIN-HC 3.5-10000-1 OT SUSP: 3.0000 [drp] | Freq: Three times a day (TID) | OTIC | 0 refills | Status: AC

## 2023-07-01 MED ORDER — OFLOXACIN 0.3 % OT SOLN: 3.0000 [drp] | Freq: Two times a day (BID) | OTIC | 0 refills | Status: DC

## 2023-07-01 MED ORDER — NEOMYCIN-POLYMYXIN-HC 3.5-10000-1 OT SUSP: 3.0000 [drp] | Freq: Three times a day (TID) | OTIC | 0 refills | Status: DC

## 2023-07-01 NOTE — ED Provider Notes (Signed)
 [facility] AT [facility] Provider Note   CSN: [id number] Arrival date & time: 06/30/23  2301     History  Chief Complaint  Patient presents with   Ear Fullness    Bradley [name] is a 23 y.o. male.  The history is provided by the patient.  Ear Fullness This is a new problem. The current episode started yesterday. The problem occurs constantly. The problem has not changed since onset.Nothing aggravates the symptoms. Nothing relieves the symptoms. He has tried nothing for the symptoms. The treatment provided no relief.  Stuck a Qtip in the ear and now cannot hear out of the right ear      Home Medications Prior to Admission medications   Medication Sig Start Date End Date Taking? Authorizing Provider  ofloxacin  (FLOXIN ) 0.3 % OTIC solution Place 3 drops into the right ear 2 (two) times daily for 7 days. 07/01/23 07/08/23 Yes Almetta Liddicoat, MD  amoxicillin  (AMOXIL ) 500 MG capsule Take 1 capsule (500 mg total) by mouth 2 (two) times daily for 7 days. 06/25/23 07/02/23  Schutt, Marsa HERO, PA-C  methocarbamol  (ROBAXIN ) 500 MG tablet Take 1 tablet (500 mg total) by mouth 2 (two) times daily. 12/24/22   Neldon Hamp RAMAN, PA      Allergies    Patient has no known allergies.    Review of Systems   Review of Systems  Constitutional:  Negative for fever.  HENT:  Positive for ear pain. Negative for ear discharge.   All other systems reviewed and are negative.   Physical Exam Updated Vital Signs BP (!) 141/90 (BP Location: Left Arm)   Pulse 78   Temp 97.7 F (36.5 C)   Resp 18   Ht 5' 6 (1.676 m)   Wt 68 kg   SpO2 99%   BMI 24.21 kg/m  Physical Exam Vitals and nursing note reviewed.  Constitutional:      General: He is not in acute distress.    Appearance: He is well-developed. He is not diaphoretic.  HENT:     Head: Normocephalic and atraumatic.     Left Ear: Tympanic membrane and ear canal normal.     Ears:     Comments:  Impacted skin and cotton in the R canal    Nose: Nose normal.     Mouth/Throat:     Mouth: Mucous membranes are moist.  Eyes:     Conjunctiva/sclera: Conjunctivae normal.     Pupils: Pupils are equal, round, and reactive to light.  Cardiovascular:     Rate and Rhythm: Normal rate and regular rhythm.  Pulmonary:     Effort: Pulmonary effort is normal.     Breath sounds: Normal breath sounds. No wheezing or rales.  Abdominal:     General: Bowel sounds are normal.     Palpations: Abdomen is soft.     Tenderness: There is no abdominal tenderness. There is no guarding or rebound.  Musculoskeletal:        General: Normal range of motion.     Cervical back: Normal range of motion and neck supple.  Skin:    General: Skin is warm and dry.     Capillary Refill: Capillary refill takes less than 2 seconds.  Neurological:     General: No focal deficit present.     Mental Status: He is alert and oriented to person, place, and time.  Psychiatric:        Mood and Affect: Mood normal.  Thought Content: Thought content normal.     ED Results / Procedures / Treatments   Labs (all labs ordered are listed, but only abnormal results are displayed) Labs Reviewed - No data to display  EKG None  Radiology No results found.  Procedures Ear Cerumen Removal  Date/Time: 07/01/2023 1:31 AM  Performed by: Nettie Earing, MD Authorized by: Nettie Earing, MD   Consent:    Consent obtained:  Verbal   Consent given by:  Patient   Risks discussed:  Bleeding, infection, incomplete removal, TM perforation and pain Universal protocol:    Patient identity confirmed:  Arm band Procedure details:    Location:  R ear   Procedure type: curette     Successful cerumen removal: cotton and skin removed.   Post-procedure details:    Inspection:  Ear canal clear and TM intact   Hearing quality:  Improved   Procedure completion:  Tolerated well, no immediate complications Comments:     TM intact  post procedure      Medications Ordered in ED Medications - No data to display  ED Course/ Medical Decision Making/ A&P                                 Medical Decision Making Qtip in the right ear canala   Amount and/or Complexity of Data Reviewed External Data Reviewed: notes.    Details: Previous notes reviewed   Risk Prescription drug management. Risk Details: Foreign body removed, cxounseled on not inserting FB in the ear canal.  Antibiotic suspension initiated.  FOllow up with ENT for ongoing care.  Stable for discharge.       Final Clinical Impression(s) / ED Diagnoses Final diagnoses:  Ear foreign body, right, sequela  Right ear pain   Return for intractable cough, coughing up blood, fevers > 100.4 unrelieved by medication, shortness of breath, intractable vomiting, chest pain, shortness of breath, weakness, numbness, changes in speech, facial asymmetry, abdominal pain, passing out, Inability to tolerate liquids or food, cough, altered mental status or any concerns. No signs of systemic illness or infection. The patient is nontoxic-appearing on exam and vital signs are within normal limits.  I have reviewed the triage vital signs and the nursing notes. Pertinent labs & imaging results that were available during my care of the patient were reviewed by me and considered in my medical decision making (see chart for details). After history, exam, and medical workup I feel the patient has been appropriately medically screened and is safe for discharge home. Pertinent diagnoses were discussed with the patient. Patient was given return precautions.  Rx / DC Orders ED Discharge Orders          Ordered    ofloxacin  (FLOXIN ) 0.3 % OTIC solution  2 times daily        07/01/23 0127              Nathanael Krist, MD 07/01/23 0133

## 2023-07-04 ENCOUNTER — Other Ambulatory Visit: Payer: Self-pay

## 2023-07-04 ENCOUNTER — Encounter (HOSPITAL_BASED_OUTPATIENT_CLINIC_OR_DEPARTMENT_OTHER): Payer: Self-pay | Admitting: Emergency Medicine

## 2023-07-04 ENCOUNTER — Other Ambulatory Visit (HOSPITAL_BASED_OUTPATIENT_CLINIC_OR_DEPARTMENT_OTHER): Payer: Self-pay

## 2023-07-04 ENCOUNTER — Emergency Department (HOSPITAL_BASED_OUTPATIENT_CLINIC_OR_DEPARTMENT_OTHER)
Admission: EM | Admit: 2023-07-04 | Discharge: 2023-07-04 | Disposition: A | Discharge status: Home / Self Care | Payer: Self-pay | Attending: Emergency Medicine | Admitting: Emergency Medicine

## 2023-07-04 DIAGNOSIS — L509 Urticaria, unspecified: Secondary | ICD-10-CM | POA: Insufficient documentation

## 2023-07-04 MED ORDER — EPINEPHRINE 0.3 MG/0.3ML IJ SOAJ
0.3000 mg | INTRAMUSCULAR | 0 refills | Status: AC | PRN
  Filled 2023-07-04: qty 2, 2d supply, fill #0

## 2023-07-04 MED ORDER — FAMOTIDINE IN NACL 20-0.9 MG/50ML-% IV SOLN
20.0000 mg | Freq: Once | INTRAVENOUS | Status: AC
  Administered 2023-07-04: 20 mg via INTRAVENOUS
  Filled 2023-07-04: qty 50

## 2023-07-04 MED ORDER — METHYLPREDNISOLONE SODIUM SUCC 125 MG IJ SOLR
125.0000 mg | Freq: Once | INTRAMUSCULAR | Status: AC
  Administered 2023-07-04: 125 mg via INTRAVENOUS
  Filled 2023-07-04: qty 2

## 2023-07-04 MED ORDER — DIPHENHYDRAMINE HCL 50 MG/ML IJ SOLN
25.0000 mg | Freq: Once | INTRAMUSCULAR | Status: DC
  Filled 2023-07-04: qty 1

## 2023-07-04 MED ORDER — PREDNISONE 20 MG PO TABS
40.0000 mg | ORAL_TABLET | Freq: Every day | ORAL | 0 refills | Status: AC
  Filled 2023-07-04: qty 6, 3d supply, fill #0

## 2023-07-04 MED ORDER — DIPHENHYDRAMINE HCL 50 MG/ML IJ SOLN
12.5000 mg | Freq: Once | INTRAMUSCULAR | Status: AC
  Administered 2023-07-04: 12.5 mg via INTRAVENOUS

## 2023-07-04 NOTE — ED Provider Notes (Signed)
[place] [facility] Provider Note   CSN: [id number] Arrival date & time: 07/04/23  4259     History  Chief Complaint  Patient presents with   Allergic Reaction    Bradley [name] is a 23 y.o. male.  With a history of ADHD presenting to the ED for evaluation of allergic reaction.  He states approximately 2 hours prior to arrival he developed few scattered hives on the back of his neck, his face, bilateral upper and lower extremities.  States he has recently been on amoxicillin for dental infection but discontinued this 4 days ago.  He states shortly before his symptoms began he ate 2 oranges.  He has had a similar reaction in the past oranges but not every time.  He denies any sore throat, difficulty breathing or swallowing, chest pain.  No nausea or vomiting.  No cough.  He has an appointment with an allergist but has not seen an allergist to date.  He does not have EpiPen's at home.  He is wondering if his symptoms are stress-induced.   Allergic Reaction Presenting symptoms: rash        Home Medications Prior to Admission medications   Medication Sig Start Date End Date Taking? Authorizing Provider  EPINEPHrine 0.3 mg/0.3 mL IJ SOAJ injection Inject 0.3 mg into the muscle as needed for anaphylaxis. 07/04/23  Yes Jhon Mallozzi, Edsel Petrin, PA-C  predniSONE (DELTASONE) 20 MG tablet Take 2 tablets (40 mg total) by mouth daily for 3 days. 07/04/23 07/07/23 Yes Taetum Flewellen, Edsel Petrin, PA-C  methocarbamol (ROBAXIN) 500 MG tablet Take 1 tablet (500 mg total) by mouth 2 (two) times daily. 12/24/22   Gailen Shelter, PA  neomycin-polymyxin-hydrocortisone (CORTISPORIN) 3.5-10000-1 OTIC suspension Place 3 drops into both ears 3 (three) times daily. X 7 days 07/01/23   Nicanor Alcon, April, MD      Allergies    Patient has no known allergies.    Review of Systems   Review of Systems  Skin:  Positive for rash.  All other systems reviewed and are  negative.   Physical Exam Updated Vital Signs BP 116/79 (BP Location: Left Arm)   Pulse 76   Temp 98.4 F (36.9 C)   Resp 16   SpO2 100%  Physical Exam Vitals and nursing note reviewed.  Constitutional:      General: He is not in acute distress.    Appearance: Normal appearance. He is normal weight. He is not ill-appearing.  HENT:     Head: Normocephalic and atraumatic.     Mouth/Throat:     Comments: Airway widely patent.  No drooling, trismus or tripoding.  Uvula midline. Pulmonary:     Effort: Pulmonary effort is normal. No respiratory distress.     Breath sounds: No wheezing, rhonchi or rales.  Abdominal:     General: Abdomen is flat.  Musculoskeletal:        General: Normal range of motion.     Cervical back: Neck supple.  Skin:    General: Skin is warm and dry.     Comments: Scattered urticaria to bilateral hands, posterior neck, lower abdomen  Neurological:     Mental Status: He is alert and oriented to person, place, and time.  Psychiatric:        Mood and Affect: Mood normal.        Behavior: Behavior normal.     ED Results / Procedures / Treatments   Labs (all labs ordered are listed, but  only abnormal results are displayed) Labs Reviewed - No data to display  EKG None  Radiology No results found.  Procedures Procedures    Medications Ordered in ED Medications  methylPREDNISolone sodium succinate (SOLU-MEDROL) 125 mg/2 mL injection 125 mg (125 mg Intravenous Given 07/04/23 1002)  famotidine (PEPCID) IVPB 20 mg premix (20 mg Intravenous New Bag/Given 07/04/23 1007)  diphenhydrAMINE (BENADRYL) injection 12.5 mg (12.5 mg Intravenous Given 07/04/23 1000)    ED Course/ Medical Decision Making/ A&P                                 Medical Decision Making Risk Prescription drug management.  This patient presents to the ED for concern of allergic reaction, this involves an extensive number of treatment options, and is a complaint that carries with it  a high risk of complications and morbidity.  The differential diagnosis includes allergic reaction, anaphylaxis  My initial workup includes symptom control  Additional history obtained from: Nursing notes from this visit.  23 year old male presenting to the ED for evaluation of an allergic reaction.  States he is wondering if he is allergic to oranges.  He had 2 medications prior to symptom onset.  He is complaining of diffuse scattered hives that are itchy.  Also was complaining of some swelling around his eyes.  He denies any respiratory complaints, chest tightness or sore throat/throat closing sensation.  No nausea or vomiting.  He is wondering if his symptoms are stress-induced as he has been under quite a bit of stress lately and this has happened before.  He was given Solu-Medrol, Benadryl and Pepcid in the emergency department.  He reported improvement in his symptoms.  Hives did appear improved on repeat examination.  Overall lower suspicion for anaphylaxis.  He was monitored in the emergency department for greater than 3 hours.  He has an appointment with an allergist in the future.  He was sent prescription for EpiPen and educated on appropriate use.  He will be started on a steroid burst.  He was given strict return precautions including signs and symptoms of anaphylaxis.  Stable at discharge.  At this time there does not appear to be any evidence of an acute emergency medical condition and the patient appears stable for discharge with appropriate outpatient follow up. Diagnosis was discussed with patient who verbalizes understanding of care plan and is agreeable to discharge. I have discussed return precautions with patient who verbalizes understanding. Patient encouraged to follow-up with their PCP within 1 week. All questions answered.  Note: Portions of this report may have been transcribed using voice recognition software. Every effort was made to ensure accuracy; however, inadvertent  computerized transcription errors may still be present.         Final Clinical Impression(s) / ED Diagnoses Final diagnoses:  Urticaria    Rx / DC Orders ED Discharge Orders          Ordered    predniSONE (DELTASONE) 20 MG tablet  Daily        07/04/23 1146    EPINEPHrine 0.3 mg/0.3 mL IJ SOAJ injection  As needed        07/04/23 1146              Michelle Piper, PA-C 07/04/23 1147    Estelle June A, DO 07/08/23 1156

## 2023-07-04 NOTE — Discharge Instructions (Signed)
You have been seen today for your complaint of rash. Your discharge medications include prednisone.  This is a steroid.  Take it as prescribed and for the entire duration of the prescription. Also sent a prescription for an EpiPen.  Take this if you feel your throat closing, chest tightness or difficulty breathing. Follow up with: Your primary care provider Please seek immediate medical care if you develop any of the following symptoms: Your tongue, lips, or eyelids swell. Your chest or throat feels tight. You have trouble breathing or swallowing. At this time there does not appear to be the presence of an emergent medical condition, however there is always the potential for conditions to change. Please read and follow the below instructions.  Do not take your medicine if  develop an itchy rash, swelling in your mouth or lips, or difficulty breathing; call 911 and seek immediate emergency medical attention if this occurs.  You may review your lab tests and imaging results in their entirety on your MyChart account.  Please discuss all results of fully with your primary care provider and other specialist at your follow-up visit.  Note: Portions of this text may have been transcribed using voice recognition software. Every effort was made to ensure accuracy; however, inadvertent computerized transcription errors may still be present.

## 2023-07-04 NOTE — ED Triage Notes (Signed)
Pt presents with c/o allergic reaction to unknown substance.  Pt did eat some oranges this am and then one hour later started with redness, swelling, pain and swelling to neck, axilla, hands, feet, legs and stomach.  Denies sob.  Pt states he has some eye burning.  Noted some eye swelling but that is not new to patient, that is baseline for him per pt.  Pt states he is to see an allergist but has not gotten an appointment yet.  No respiratory distress, airway open and patent.

## 2023-07-12 ENCOUNTER — Other Ambulatory Visit: Payer: Self-pay

## 2023-07-12 ENCOUNTER — Encounter (HOSPITAL_BASED_OUTPATIENT_CLINIC_OR_DEPARTMENT_OTHER): Payer: Self-pay | Admitting: *Deleted

## 2023-07-12 ENCOUNTER — Emergency Department (HOSPITAL_BASED_OUTPATIENT_CLINIC_OR_DEPARTMENT_OTHER)
Admission: EM | Admit: 2023-07-12 | Discharge: 2023-07-13 | Disposition: A | Discharge status: Home / Self Care | Payer: No Typology Code available for payment source | Attending: Emergency Medicine | Admitting: Emergency Medicine

## 2023-07-12 DIAGNOSIS — R300 Dysuria: Secondary | ICD-10-CM | POA: Insufficient documentation

## 2023-07-12 DIAGNOSIS — R059 Cough, unspecified: Secondary | ICD-10-CM | POA: Diagnosis not present

## 2023-07-12 DIAGNOSIS — R519 Headache, unspecified: Secondary | ICD-10-CM | POA: Diagnosis not present

## 2023-07-12 DIAGNOSIS — R5383 Other fatigue: Secondary | ICD-10-CM | POA: Diagnosis not present

## 2023-07-12 DIAGNOSIS — Z20822 Contact with and (suspected) exposure to covid-19: Secondary | ICD-10-CM | POA: Insufficient documentation

## 2023-07-12 DIAGNOSIS — Z202 Contact with and (suspected) exposure to infections with a predominantly sexual mode of transmission: Secondary | ICD-10-CM | POA: Insufficient documentation

## 2023-07-12 DIAGNOSIS — Z711 Person with feared health complaint in whom no diagnosis is made: Secondary | ICD-10-CM

## 2023-07-12 DIAGNOSIS — R6883 Chills (without fever): Secondary | ICD-10-CM | POA: Insufficient documentation

## 2023-07-12 DIAGNOSIS — R1030 Lower abdominal pain, unspecified: Secondary | ICD-10-CM | POA: Insufficient documentation

## 2023-07-12 LAB — URINALYSIS, ROUTINE W REFLEX MICROSCOPIC
Bilirubin Urine: NEGATIVE
Glucose, UA: NEGATIVE mg/dL
Hgb urine dipstick: NEGATIVE
Ketones, ur: NEGATIVE mg/dL
Leukocytes,Ua: NEGATIVE
Nitrite: NEGATIVE
Protein, ur: NEGATIVE mg/dL
Specific Gravity, Urine: 1.02 (ref 1.005–1.030)
pH: 8 (ref 5.0–8.0)

## 2023-07-12 LAB — RESP PANEL BY RT-PCR (RSV, FLU A&B, COVID)  RVPGX2
Influenza A by PCR: NEGATIVE
Influenza B by PCR: NEGATIVE
Resp Syncytial Virus by PCR: NEGATIVE
SARS Coronavirus 2 by RT PCR: NEGATIVE

## 2023-07-12 LAB — CBG MONITORING, ED: Glucose-Capillary: 93 mg/dL (ref 70–99)

## 2023-07-12 MED ORDER — CEFTRIAXONE SODIUM 500 MG IJ SOLR
500.0000 mg | Freq: Once | INTRAMUSCULAR | Status: AC
  Administered 2023-07-12: 500 mg via INTRAMUSCULAR
  Filled 2023-07-12: qty 500

## 2023-07-12 MED ORDER — DOXYCYCLINE HYCLATE 100 MG PO CAPS: 100.0000 mg | ORAL_CAPSULE | Freq: Two times a day (BID) | ORAL | 0 refills | Status: AC

## 2023-07-12 MED ORDER — DOXYCYCLINE HYCLATE 100 MG PO TABS
100.0000 mg | ORAL_TABLET | Freq: Once | ORAL | Status: AC
  Administered 2023-07-12: 100 mg via ORAL
  Filled 2023-07-12: qty 1

## 2023-07-12 MED ORDER — LIDOCAINE HCL (PF) 1 % IJ SOLN
1.0000 mL | Freq: Once | INTRAMUSCULAR | Status: AC
  Administered 2023-07-12: 1 mL
  Filled 2023-07-12: qty 5

## 2023-07-12 NOTE — Discharge Instructions (Signed)
Make sure to follow-up on your results  I have started on antibiotics in case this is an STD  Follow-up outpatient, return for any worsening symptoms

## 2023-07-12 NOTE — ED Triage Notes (Signed)
Pt is here for evaluation of burning with urination and some cloudiness in his urine.  Pt has also been having headaches and feeling fatigued and having chills and feeling shaky.  He also notes that he would like to be checked for a STI.

## 2023-07-12 NOTE — ED Provider Notes (Signed)
[facility] Provider Note   CSN: [id number] Arrival date & time: 07/12/23  2008     History  Chief Complaint  Patient presents with   Exposure to STD    Bradley [name] is a 23 y.o. male here for evaluation of concern for STD.  He states he has had some burning with urination and cloudiness to his urine.  He has had some fatigue over patient last few days.  He gets intermittent lower abdominal pain for weeks.  Initially had some loose stool which resolved.  He is concerned as his partner was sexually active with someone else he is concern for HIV.  He has had chills without documented fever.  Also had HA, cough, congestion, rhinorrhea, sore throat which resolved  HPI     Home Medications Prior to Admission medications   Medication Sig Start Date End Date Taking? Authorizing Provider  doxycycline (VIBRAMYCIN) 100 MG capsule Take 1 capsule (100 mg total) by mouth 2 (two) times daily. 07/12/23  Yes Annasophia Crocker A, PA-C  EPINEPHrine 0.3 mg/0.3 mL IJ SOAJ injection Inject 0.3 mg into the muscle as needed for anaphylaxis. 07/04/23   Schutt, Edsel Petrin, PA-C  methocarbamol (ROBAXIN) 500 MG tablet Take 1 tablet (500 mg total) by mouth 2 (two) times daily. 12/24/22   Gailen Shelter, PA  neomycin-polymyxin-hydrocortisone (CORTISPORIN) 3.5-10000-1 OTIC suspension Place 3 drops into both ears 3 (three) times daily. X 7 days 07/01/23   Nicanor Alcon, April, MD      Allergies    Patient has no known allergies.    Review of Systems   Review of Systems  Constitutional:  Positive for appetite change and fatigue.  HENT:  Positive for congestion, postnasal drip, rhinorrhea and sore throat.   Respiratory:  Positive for cough.   Cardiovascular: Negative.   Gastrointestinal:  Positive for abdominal pain.  Genitourinary:  Positive for dysuria. Negative for decreased urine volume, penile pain, penile swelling, scrotal swelling, testicular pain and  urgency.  Musculoskeletal:  Positive for myalgias.  Skin: Negative.   Neurological:  Positive for headaches.  All other systems reviewed and are negative.   Physical Exam Updated Vital Signs BP (!) 145/90   Pulse 74   Temp 98.5 F (36.9 C) (Oral)   Resp 18   SpO2 99%  Physical Exam Vitals and nursing note reviewed.  Constitutional:      General: He is not in acute distress.    Appearance: He is well-developed. He is not ill-appearing, toxic-appearing or diaphoretic.  HENT:     Head: Normocephalic and atraumatic.     Jaw: There is normal jaw occlusion.     Right Ear: Tympanic membrane, ear canal and external ear normal. There is no impacted cerumen. No hemotympanum. Tympanic membrane is not injected, scarred, perforated, erythematous, retracted or bulging.     Left Ear: Tympanic membrane, ear canal and external ear normal. There is no impacted cerumen. No hemotympanum. Tympanic membrane is not injected, scarred, perforated, erythematous, retracted or bulging.     Ears:     Comments: No Mastoid tenderness.    Nose: Nose normal.     Comments: Clear rhinorrhea and congestion to bilateral nares.  No sinus tenderness.    Mouth/Throat:     Mouth: Mucous membranes are moist.     Comments: Posterior oropharynx clear.  Mucous membranes moist.  Tonsils without erythema or exudate.  Uvula midline without deviation.  No evidence of PTA or RPA.  No  drooling, dysphasia or trismus.  Phonation normal. Eyes:     Pupils: Pupils are equal, round, and reactive to light.  Neck:     Trachea: Trachea and phonation normal.     Meningeal: Brudzinski's sign and Kernig's sign absent.     Comments: No Neck stiffness or neck rigidity.  No meningismus.  No cervical lymphadenopathy. Cardiovascular:     Rate and Rhythm: Normal rate and regular rhythm.     Pulses: Normal pulses.     Heart sounds: Normal heart sounds.     Comments: No murmurs rubs or gallops. Pulmonary:     Effort: Pulmonary effort is  normal. No respiratory distress.     Breath sounds: Normal breath sounds.     Comments: Clear to auscultation bilaterally without wheeze, rhonchi or rales.  No accessory muscle usage.  Able speak in full sentences. Abdominal:     General: Bowel sounds are normal. There is no distension.     Palpations: Abdomen is soft.     Comments: Soft, nontender without rebound or guarding.  No CVA tenderness.  Musculoskeletal:        General: No swelling, tenderness, deformity or signs of injury. Normal range of motion.     Cervical back: Normal range of motion and neck supple.     Right lower leg: No edema.     Left lower leg: No edema.     Comments: Moves all 4 extremities without difficulty.  Lower extremities without edema, erythema or warmth.  Skin:    General: Skin is warm and dry.     Capillary Refill: Capillary refill takes less than 2 seconds.     Comments: Brisk capillary refill.  No rashes or lesions.  Neurological:     General: No focal deficit present.     Mental Status: He is alert and oriented to person, place, and time.     Cranial Nerves: No cranial nerve deficit.     Sensory: No sensory deficit.     Motor: No weakness.     Gait: Gait normal.     Comments: Ambulatory in department without difficulty.  Cranial nerves II through XII grossly intact.  No facial droop.  No aphasia.    ED Results / Procedures / Treatments   Labs (all labs ordered are listed, but only abnormal results are displayed) Labs Reviewed  URINALYSIS, ROUTINE W REFLEX MICROSCOPIC - Abnormal; Notable for the following components:      Result Value   APPearance CLOUDY (*)    All other components within normal limits  RESP PANEL BY RT-PCR (RSV, FLU A&B, COVID)  RVPGX2  HIV ANTIBODY (ROUTINE TESTING W REFLEX)  RPR  CBG MONITORING, ED  GC/CHLAMYDIA PROBE AMP ([place]) NOT AT [facility]    EKG None  Radiology No results found.  Procedures Procedures    Medications Ordered in ED Medications   cefTRIAXone (ROCEPHIN) injection 500 mg (500 mg Intramuscular Given 07/12/23 2313)  lidocaine (PF) (XYLOCAINE) 1 % injection 1-2.1 mL (1 mL Other Given 07/12/23 2313)  doxycycline (VIBRA-TABS) tablet 100 mg (100 mg Oral Given 07/12/23 2313)   ED Course/ Medical Decision Making/ A&P   23 year old here for evaluation of concern for STI.  Significant other apparently had sexual encounter with other partner.  Patient had viral symptoms about 2 weeks ago.  Now having some dysuria and cloudiness.  No rashes or lesions.  He is nonfocal neuroexam without deficits.  Viral symptoms are resolved.  Labs personally viewed and interpreted:  UA  negative for infection CBG 93 Viral panel negative  Will treat for STI. Will have his FU outpatient. Return for new or worsening symptoms  The patient has been appropriately medically screened and/or stabilized in the ED. I have low suspicion for any other emergent medical condition which would require further screening, evaluation or treatment in the ED or require inpatient management.  Patient is hemodynamically stable and in no acute distress.  Patient able to ambulate in department prior to ED.  Evaluation does not show acute pathology that would require ongoing or additional emergent interventions while in the emergency department or further inpatient treatment.  I have discussed the diagnosis with the patient and answered all questions.  Pain is been managed while in the emergency department and patient has no further complaints prior to discharge.  Patient is comfortable with plan discussed in room and is stable for discharge at this time.  I have discussed strict return precautions for returning to the emergency department.  Patient was encouraged to follow-up with PCP/specialist refer to at discharge.                                Medical Decision Making Amount and/or Complexity of Data Reviewed External Data Reviewed: labs, radiology and notes. Labs:  ordered. Decision-making details documented in ED Course.  Risk OTC drugs. Prescription drug management. Decision regarding hospitalization. Diagnosis or treatment significantly limited by social determinants of health.         Final Clinical Impression(s) / ED Diagnoses Final diagnoses:  Concern about STD in male without diagnosis    Rx / DC Orders ED Discharge Orders          Ordered    doxycycline (VIBRAMYCIN) 100 MG capsule  2 times daily        07/12/23 2315              Justyne Roell A, PA-C 07/12/23 2332    Charlynne Pander, MD 07/13/23 1451

## 2023-07-13 LAB — RPR: RPR Ser Ql: NONREACTIVE

## 2023-07-13 LAB — HIV ANTIBODY (ROUTINE TESTING W REFLEX): HIV Screen 4th Generation wRfx: NONREACTIVE

## 2023-07-14 LAB — GC/CHLAMYDIA PROBE AMP (~~LOC~~) NOT AT ARMC
Chlamydia: NEGATIVE
Comment: NEGATIVE
Comment: NORMAL
Neisseria Gonorrhea: NEGATIVE

## 2023-07-16 ENCOUNTER — Ambulatory Visit (INDEPENDENT_AMBULATORY_CARE_PROVIDER_SITE_OTHER): Payer: No Typology Code available for payment source | Admitting: Family Medicine

## 2023-07-16 ENCOUNTER — Encounter: Payer: Self-pay | Admitting: Family Medicine

## 2023-07-16 VITALS — BP 112/70 | HR 90 | Temp 98.3°F | Resp 16 | Ht 66.0 in | Wt 147.8 lb

## 2023-07-16 DIAGNOSIS — R1084 Generalized abdominal pain: Secondary | ICD-10-CM

## 2023-07-16 NOTE — Progress Notes (Signed)
Chief Complaint  Patient presents with   ED follow up    Subjective: Patient is a 23 y.o. male here for dysuria.  Dry cough (resolved 1 week ago), chills, slight ST, R sided abd pain. Was having dysuria but resolved. BM's WNL. Started 2.5 weeks ago. Went to ED. Infection r/o. No trauma. No N/V/D/C, fevers, bleeding. Food does not affect it. GF had similar s/s's, he is sexually active. She had unprotected intercourse with a man who she is unsure of her status.   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 No accessory muscle use Abd: BS+, S, NT, ND MSK: +ttp over lateral lower rib cage on R, no edema or ecchymosis, no deformity Psych: Age appropriate judgment and insight, normal affect and mood  Assessment and Plan: Generalized abdominal pain - Plan: CBC, Comprehensive metabolic panel  Check labs.  Stretches and exercises for mid back provided.  Should consider rechecking HIV testing in 6 months.  Some symptoms have resolved.  Could be related to stress.  Will see what labs show and track his improvement. The patient voiced understanding and agreement to the plan.  Jilda Roche Burton, DO 07/16/23  3:35 PM

## 2023-07-16 NOTE — Patient Instructions (Addendum)
Give [place] 2-3 business days to get the results of your labs back.   Try to drink 55-60 oz of water daily outside of exercise.  OK to take Tylenol 1000 mg (2 extra strength tabs) or 975 mg (3 regular strength tabs) every 6 hours as needed.  Ibuprofen 400-600 mg (2-3 over the counter strength tabs) every 6 hours as needed for pain.  Let us know if you need anything.  Mid-Back Strain Rehab It is normal to feel mild stretching, pulling, tightness, or discomfort as you do these exercises, but you should stop right away if you feel sudden pain or your pain gets worse.  Stretching and range of motion exercises This exercise warms up your muscles and joints and improves the movement and flexibility of your back and shoulders. This exercise also help to relieve pain. Exercise A: Chest and spine stretch  Lie down on your back on a firm surface. Roll a towel or a small blanket so it is about 4 inches (10 cm) in diameter. Put the towel lengthwise under the middle of your back so it is under your spine, but not under your shoulder blades. To increase the stretch, you may put your hands behind your head and let your elbows fall to your sides. Hold for 30 seconds. Repeat exercise 2 times. Complete this exercise 3 times per week. Strengthening exercises These exercises build strength and endurance in your back and your shoulder blade muscles. Endurance is the ability to use your muscles for a long time, even after they get tired. Exercise C: Straight arm rows (shoulder extension) Stand with your feet shoulder width apart. Secure an exercise band to a stable object in front of you so the band is at or above shoulder height. Hold one end of the exercise band in each hand. Straighten your elbows and lift your hands up to shoulder height. Step back, away from the secured end of the exercise band, until the band stretches. Squeeze your shoulder blades together and pull your hands down to the sides of your  thighs. Stop when your hands are straight down by your sides. Do not let your hands go behind your body. Hold for 2 seconds. Slowly return to the starting position. Repeat 2 times. Complete this exercise 3 times per week. Exercise D: Shoulder external rotation, prone Lie on your abdomen on a firm bed so your left / right forearm hangs over the edge of the bed and your upper arm is on the bed, straight out from your body. Your elbow should be bent. Your palm should be facing your feet. If instructed, hold a 2-5 lb weight in your hand. Squeeze your shoulder blade toward the middle of your back. Do not let your shoulder lift toward your ear. Keep your elbow bent in an "L" shape (90 degrees) while you slowly move your forearm up toward the ceiling. Move your forearm up to the height of the bed, toward your head. Your upper arm should not move. At the top of the movement, your palm should face the floor. Hold for 1 second. Slowly return to the starting position and relax your muscles. Repeat 2 times. Complete this exercise 3 times per week. Exercise E: Scapular retraction and external rotation, rowing  Sit in a stable chair without armrests, or stand. Secure an exercise band to a stable object in front of you so it is at shoulder height. Hold one end of the exercise band in each hand. Bring your arms out straight  in front of you. Step back, away from the secured end of the exercise band, until the band stretches. Pull the band backward. As you do this, bend your elbows and squeeze your shoulder blades together, but avoid letting the rest of your body move. Do not let your shoulders lift up toward your ears. Stop when your elbows are at your sides or slightly behind your body. Hold for 1 second1. Slowly straighten your arms to return to the starting position. Repeat 2 times. Complete this exercise 3 times per week. Posture and body mechanics  Body mechanics refers to the movements and  positions of your body while you do your daily activities. Posture is part of body mechanics. Good posture and healthy body mechanics can help to relieve stress in your body's tissues and joints. Good posture means that your spine is in its natural S-curve position (your spine is neutral), your shoulders are pulled back slightly, and your head is not tipped forward. The following are general guidelines for applying improved posture and body mechanics to your everyday activities. Standing  When standing, keep your spine neutral and your feet about hip-width apart. Keep a slight bend in your knees. Your ears, shoulders, and hips should line up. When you do a task in which you lean forward while standing in one place for a long time, place one foot up on a stable object that is 2-4 inches (5-10 cm) high, such as a footstool. This helps keep your spine neutral. Sitting  When sitting, keep your spine neutral and keep your feet flat on the floor. Use a footrest, if necessary, and keep your thighs parallel to the floor. Avoid rounding your shoulders, and avoid tilting your head forward. When working at a desk or a computer, keep your desk at a height where your hands are slightly lower than your elbows. Slide your chair under your desk so you are close enough to maintain good posture. When working at a computer, place your monitor at a height where you are looking straight ahead and you do not have to tilt your head forward or downward to look at the screen. Resting  When lying down and resting, avoid positions that are most painful for you. If you have pain with activities such as sitting, bending, stooping, or squatting (flexion-based activities), lie in a position in which your body does not bend very much. For example, avoid curling up on your side with your arms and knees near your chest (fetal position). If you have pain with activities such as standing for a long time or reaching with your arms  (extension-based activities), lie with your spine in a neutral position and bend your knees slightly. Try the following positions: Lying on your side with a pillow between your knees. Lying on your back with a pillow under your knees.  Lifting  When lifting objects, keep your feet at least shoulder-width apart and tighten your abdominal muscles. Bend your knees and hips and keep your spine neutral. It is important to lift using the strength of your legs, not your back. Do not lock your knees straight out. Always ask for help to lift heavy or awkward objects. Make sure you discuss any questions you have with your health care provider. Document Released: 06/03/2005 Document Revised: 02/08/2016 Document Reviewed: 03/15/2015 Elsevier Interactive Patient Education  [employer].

## 2023-07-17 ENCOUNTER — Encounter: Payer: Self-pay | Admitting: Family Medicine

## 2023-07-17 LAB — CBC
HCT: 48.9 % (ref 39.0–52.0)
Hemoglobin: 15.8 g/dL (ref 13.0–17.0)
MCHC: 32.3 g/dL (ref 30.0–36.0)
MCV: 85.9 fL (ref 78.0–100.0)
Platelets: 221 10*3/uL (ref 150.0–400.0)
RBC: 5.69 Mil/uL (ref 4.22–5.81)
RDW: 13.5 % (ref 11.5–15.5)
WBC: 6.1 10*3/uL (ref 4.0–10.5)

## 2023-07-17 LAB — COMPREHENSIVE METABOLIC PANEL
ALT: 25 U/L (ref 0–53)
AST: 27 U/L (ref 0–37)
Albumin: 4.7 g/dL (ref 3.5–5.2)
Alkaline Phosphatase: 62 U/L (ref 39–117)
BUN: 11 mg/dL (ref 6–23)
CO2: 27 meq/L (ref 19–32)
Calcium: 9.8 mg/dL (ref 8.4–10.5)
Chloride: 102 meq/L (ref 96–112)
Creatinine, Ser: 1.26 mg/dL (ref 0.40–1.50)
GFR: 79.65 mL/min (ref >60.00)
Glucose, Bld: 75 mg/dL (ref 70–99)
Potassium: 4 meq/L (ref 3.5–5.1)
Sodium: 140 meq/L (ref 135–145)
Total Bilirubin: 0.4 mg/dL (ref 0.2–1.2)
Total Protein: 7.3 g/dL (ref 6.0–8.3)

## 2023-10-10 ENCOUNTER — Encounter: Payer: BC Managed Care – PPO | Admitting: Family Medicine
